# Patient Record
Sex: Female | Born: 1970 | Race: Black or African American | Hispanic: No | Marital: Single | Smoking: Never smoker
Health system: Southern US, Community
[De-identification: ages and names within clinical notes are randomized; demographics above are authoritative.]

## PROBLEM LIST (undated history)

## (undated) DIAGNOSIS — IMO0001 Reserved for inherently not codable concepts without codable children: Secondary | ICD-10-CM

## (undated) DIAGNOSIS — I10 Essential (primary) hypertension: Secondary | ICD-10-CM

## (undated) DIAGNOSIS — E039 Hypothyroidism, unspecified: Secondary | ICD-10-CM

## (undated) HISTORY — PX: BUNIONECTOMY: SHX129

## (undated) HISTORY — DX: Essential (primary) hypertension: I10

## (undated) HISTORY — PX: BREAST SURGERY: SHX581

## (undated) HISTORY — DX: Reserved for inherently not codable concepts without codable children: IMO0001

## (undated) HISTORY — PX: GASTRIC BYPASS: SHX52

## (undated) HISTORY — DX: Hypothyroidism, unspecified: E03.9

---

## 1998-04-30 ENCOUNTER — Emergency Department (HOSPITAL_COMMUNITY): Admission: EM | Admit: 1998-04-30 | Discharge: 1998-04-30 | Payer: Self-pay | Admitting: Emergency Medicine

## 1998-09-27 ENCOUNTER — Ambulatory Visit: Admission: RE | Admit: 1998-09-27 | Discharge: 1998-09-27 | Payer: Self-pay | Admitting: Otolaryngology

## 1999-02-07 ENCOUNTER — Other Ambulatory Visit: Admission: RE | Admit: 1999-02-07 | Discharge: 1999-02-07 | Payer: Self-pay | Admitting: Obstetrics and Gynecology

## 1999-10-21 ENCOUNTER — Emergency Department (HOSPITAL_COMMUNITY): Admission: EM | Admit: 1999-10-21 | Discharge: 1999-10-21 | Payer: Self-pay | Admitting: Emergency Medicine

## 1999-11-02 ENCOUNTER — Ambulatory Visit: Admission: RE | Admit: 1999-11-02 | Discharge: 1999-11-02 | Payer: Self-pay | Admitting: Otolaryngology

## 2001-02-25 ENCOUNTER — Emergency Department (HOSPITAL_COMMUNITY): Admission: EM | Admit: 2001-02-25 | Discharge: 2001-02-25 | Payer: Self-pay | Admitting: Emergency Medicine

## 2001-02-25 ENCOUNTER — Encounter: Payer: Self-pay | Admitting: Emergency Medicine

## 2001-11-17 ENCOUNTER — Encounter: Payer: Self-pay | Admitting: Emergency Medicine

## 2001-11-17 ENCOUNTER — Emergency Department (HOSPITAL_COMMUNITY): Admission: EM | Admit: 2001-11-17 | Discharge: 2001-11-17 | Payer: Self-pay | Admitting: Emergency Medicine

## 2007-06-29 ENCOUNTER — Other Ambulatory Visit: Admission: RE | Admit: 2007-06-29 | Discharge: 2007-06-29 | Payer: Self-pay | Admitting: Gynecology

## 2007-07-27 ENCOUNTER — Encounter: Admission: RE | Admit: 2007-07-27 | Discharge: 2007-07-27 | Payer: Self-pay | Admitting: Internal Medicine

## 2007-08-19 ENCOUNTER — Encounter (INDEPENDENT_AMBULATORY_CARE_PROVIDER_SITE_OTHER): Payer: Self-pay | Admitting: Interventional Radiology

## 2007-08-19 ENCOUNTER — Encounter: Admission: RE | Admit: 2007-08-19 | Discharge: 2007-08-19 | Payer: Self-pay | Admitting: Internal Medicine

## 2007-08-19 ENCOUNTER — Other Ambulatory Visit: Admission: RE | Admit: 2007-08-19 | Discharge: 2007-08-19 | Payer: Self-pay | Admitting: Interventional Radiology

## 2007-10-29 ENCOUNTER — Emergency Department (HOSPITAL_COMMUNITY): Admission: EM | Admit: 2007-10-29 | Discharge: 2007-10-29 | Payer: Self-pay | Admitting: Emergency Medicine

## 2008-03-09 ENCOUNTER — Encounter: Admission: RE | Admit: 2008-03-09 | Discharge: 2008-03-09 | Payer: Self-pay | Admitting: Internal Medicine

## 2008-06-09 ENCOUNTER — Encounter: Payer: Self-pay | Admitting: Endocrinology

## 2008-07-28 ENCOUNTER — Ambulatory Visit: Payer: Self-pay | Admitting: Gynecology

## 2008-07-28 ENCOUNTER — Encounter: Payer: Self-pay | Admitting: Gynecology

## 2008-07-28 ENCOUNTER — Other Ambulatory Visit: Admission: RE | Admit: 2008-07-28 | Discharge: 2008-07-28 | Payer: Self-pay | Admitting: Gynecology

## 2008-08-08 ENCOUNTER — Encounter: Admission: RE | Admit: 2008-08-08 | Discharge: 2008-08-08 | Payer: Self-pay | Admitting: Gynecology

## 2009-01-11 ENCOUNTER — Other Ambulatory Visit: Admission: RE | Admit: 2009-01-11 | Discharge: 2009-01-11 | Payer: Self-pay | Admitting: Internal Medicine

## 2009-08-16 ENCOUNTER — Emergency Department (HOSPITAL_COMMUNITY): Admission: EM | Admit: 2009-08-16 | Discharge: 2009-08-16 | Payer: Self-pay | Admitting: Emergency Medicine

## 2010-07-01 ENCOUNTER — Emergency Department (HOSPITAL_COMMUNITY)
Admission: EM | Admit: 2010-07-01 | Discharge: 2010-07-01 | Payer: Self-pay | Source: Home / Self Care | Admitting: Emergency Medicine

## 2010-08-12 ENCOUNTER — Encounter: Payer: Self-pay | Admitting: Internal Medicine

## 2010-10-18 ENCOUNTER — Other Ambulatory Visit: Payer: Self-pay | Admitting: Gynecology

## 2010-10-18 ENCOUNTER — Encounter (INDEPENDENT_AMBULATORY_CARE_PROVIDER_SITE_OTHER): Payer: Self-pay | Admitting: Gynecology

## 2010-10-18 ENCOUNTER — Other Ambulatory Visit (HOSPITAL_COMMUNITY)
Admission: RE | Admit: 2010-10-18 | Discharge: 2010-10-18 | Disposition: A | Discharge status: Home / Self Care | Payer: Self-pay | Source: Ambulatory Visit | Attending: Gynecology | Admitting: Gynecology

## 2010-10-18 DIAGNOSIS — Z833 Family history of diabetes mellitus: Secondary | ICD-10-CM

## 2010-10-18 DIAGNOSIS — Z01419 Encounter for gynecological examination (general) (routine) without abnormal findings: Secondary | ICD-10-CM

## 2010-10-18 DIAGNOSIS — R635 Abnormal weight gain: Secondary | ICD-10-CM

## 2010-10-18 DIAGNOSIS — N938 Other specified abnormal uterine and vaginal bleeding: Secondary | ICD-10-CM

## 2010-10-18 DIAGNOSIS — Z13220 Encounter for screening for lipoid disorders: Secondary | ICD-10-CM

## 2010-10-18 DIAGNOSIS — Z124 Encounter for screening for malignant neoplasm of cervix: Secondary | ICD-10-CM | POA: Insufficient documentation

## 2010-10-18 DIAGNOSIS — N949 Unspecified condition associated with female genital organs and menstrual cycle: Secondary | ICD-10-CM

## 2010-10-21 ENCOUNTER — Ambulatory Visit (INDEPENDENT_AMBULATORY_CARE_PROVIDER_SITE_OTHER): Payer: Self-pay | Admitting: Gynecology

## 2010-10-21 DIAGNOSIS — N920 Excessive and frequent menstruation with regular cycle: Secondary | ICD-10-CM

## 2010-10-21 DIAGNOSIS — D259 Leiomyoma of uterus, unspecified: Secondary | ICD-10-CM

## 2010-11-11 ENCOUNTER — Ambulatory Visit (INDEPENDENT_AMBULATORY_CARE_PROVIDER_SITE_OTHER): Payer: Self-pay | Admitting: Gynecology

## 2010-11-11 DIAGNOSIS — N949 Unspecified condition associated with female genital organs and menstrual cycle: Secondary | ICD-10-CM

## 2010-11-11 DIAGNOSIS — IMO0001 Reserved for inherently not codable concepts without codable children: Secondary | ICD-10-CM

## 2010-11-11 DIAGNOSIS — N938 Other specified abnormal uterine and vaginal bleeding: Secondary | ICD-10-CM

## 2010-11-11 HISTORY — DX: Reserved for inherently not codable concepts without codable children: IMO0001

## 2010-11-25 ENCOUNTER — Ambulatory Visit (INDEPENDENT_AMBULATORY_CARE_PROVIDER_SITE_OTHER): Payer: Self-pay | Admitting: Gynecology

## 2010-11-25 DIAGNOSIS — N898 Other specified noninflammatory disorders of vagina: Secondary | ICD-10-CM

## 2010-11-25 DIAGNOSIS — N760 Acute vaginitis: Secondary | ICD-10-CM

## 2010-11-25 DIAGNOSIS — N949 Unspecified condition associated with female genital organs and menstrual cycle: Secondary | ICD-10-CM

## 2010-11-25 DIAGNOSIS — N938 Other specified abnormal uterine and vaginal bleeding: Secondary | ICD-10-CM

## 2010-12-09 ENCOUNTER — Ambulatory Visit: Payer: Self-pay | Admitting: Gynecology

## 2011-02-18 ENCOUNTER — Emergency Department (HOSPITAL_COMMUNITY)
Admission: EM | Admit: 2011-02-18 | Discharge: 2011-02-18 | Disposition: A | Discharge status: Home / Self Care | Payer: Self-pay | Attending: Emergency Medicine | Admitting: Emergency Medicine

## 2011-02-18 DIAGNOSIS — M719 Bursopathy, unspecified: Secondary | ICD-10-CM | POA: Insufficient documentation

## 2011-02-18 DIAGNOSIS — M67919 Unspecified disorder of synovium and tendon, unspecified shoulder: Secondary | ICD-10-CM | POA: Insufficient documentation

## 2011-02-18 DIAGNOSIS — I10 Essential (primary) hypertension: Secondary | ICD-10-CM | POA: Insufficient documentation

## 2011-04-23 DIAGNOSIS — E039 Hypothyroidism, unspecified: Secondary | ICD-10-CM | POA: Insufficient documentation

## 2011-04-23 DIAGNOSIS — I10 Essential (primary) hypertension: Secondary | ICD-10-CM | POA: Insufficient documentation

## 2011-04-23 DIAGNOSIS — IMO0001 Reserved for inherently not codable concepts without codable children: Secondary | ICD-10-CM | POA: Insufficient documentation

## 2011-04-24 ENCOUNTER — Other Ambulatory Visit: Payer: Self-pay | Admitting: Gynecology

## 2011-04-24 DIAGNOSIS — N920 Excessive and frequent menstruation with regular cycle: Secondary | ICD-10-CM

## 2011-04-28 ENCOUNTER — Inpatient Hospital Stay: Admission: RE | Admit: 2011-04-28 | Payer: Self-pay | Source: Ambulatory Visit

## 2011-04-28 ENCOUNTER — Ambulatory Visit: Payer: Self-pay | Admitting: Gynecology

## 2011-04-29 ENCOUNTER — Ambulatory Visit (INDEPENDENT_AMBULATORY_CARE_PROVIDER_SITE_OTHER): Payer: Self-pay

## 2011-04-29 ENCOUNTER — Ambulatory Visit (INDEPENDENT_AMBULATORY_CARE_PROVIDER_SITE_OTHER): Payer: Self-pay | Admitting: Gynecology

## 2011-04-29 ENCOUNTER — Ambulatory Visit: Payer: Self-pay

## 2011-04-29 ENCOUNTER — Ambulatory Visit: Payer: Self-pay | Admitting: Gynecology

## 2011-04-29 ENCOUNTER — Encounter: Payer: Self-pay | Admitting: Gynecology

## 2011-04-29 VITALS — BP 134/88

## 2011-04-29 DIAGNOSIS — N8300 Follicular cyst of ovary, unspecified side: Secondary | ICD-10-CM

## 2011-04-29 DIAGNOSIS — I10 Essential (primary) hypertension: Secondary | ICD-10-CM

## 2011-04-29 DIAGNOSIS — D259 Leiomyoma of uterus, unspecified: Secondary | ICD-10-CM

## 2011-04-29 DIAGNOSIS — E039 Hypothyroidism, unspecified: Secondary | ICD-10-CM

## 2011-04-29 DIAGNOSIS — N938 Other specified abnormal uterine and vaginal bleeding: Secondary | ICD-10-CM

## 2011-04-29 DIAGNOSIS — D251 Intramural leiomyoma of uterus: Secondary | ICD-10-CM

## 2011-04-29 DIAGNOSIS — N949 Unspecified condition associated with female genital organs and menstrual cycle: Secondary | ICD-10-CM

## 2011-04-29 NOTE — Progress Notes (Signed)
The patient is a 40 year old gravida 0 with a same-sex relationship who presented to the office for an ultrasound and further discussion of her spotting she's had ever since she had a Mirena IUD placed back in April of this year. The Mirena IUD had been placed because of her history of menorrhagia and dysmenorrhea which has improved tremendously and she's happy with the outcome. She has a history of hypothyroidism and hypertension she has not seen her primary physician and close to a year. She is on Norvasc 10 mg daily and also on Synthroid 175 mcg daily. The patient is overweight.  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 Both ovaries appeared to be normal. The IUD was seen in normal position and the intrauterine cavity. Endometrial stripe was 4.1 mm.  Because of her history of hypothyroidism but has not had her thyroid checked and quite some time we'll go and check her TSH today. We'll check her prolactin as part of her evaluation for dysfunctional uterine bleeding. She'll be placed on Megace 40 mg twice a day for 15 days. We'll monitor her symptoms afterwards. We'll also check her CBC to look for platelet count as well. Her Pap smear in April of this year was normal. She was encouraged to continue monthly self breast examination and will follow with the above symptoms over the course of the next 3-6 months. She was instructed to engage in some form of exercise 45 minutes 34 times a week.

## 2011-04-29 NOTE — Patient Instructions (Signed)
Take the Megace one tablet two times a day for 15 days. Will call you if lab results are abnormal.

## 2011-05-19 ENCOUNTER — Telehealth: Payer: Self-pay | Admitting: *Deleted

## 2011-05-19 NOTE — Telephone Encounter (Signed)
Patient was seen on 04/29/11 with Dr. Glenetta Hew.  Has been on Megace 40mg  BID for over two weeks now, but does continue to bleed some.  Not really a flow, but it never went away.  Should we increase per day or come up with another plan?

## 2011-05-19 NOTE — Telephone Encounter (Signed)
Patient has a Mirena IUD. She's had a normal TSH and prolactin,  Amy please call patient and instruct to monitor her bleeding, how many pads per day, if greater than one pad an hour instructed to call immediately. Review she may have cycles monthly with the IUD. If continues to bleed daily to schedule an appointment with Dr. Lily Peer.

## 2011-05-20 NOTE — Telephone Encounter (Signed)
Patient informed.  Only changing pad once daily.  Will monitor.

## 2011-05-22 ENCOUNTER — Telehealth: Payer: Self-pay | Admitting: *Deleted

## 2011-05-22 NOTE — Telephone Encounter (Signed)
Pt called complaining of still spotting while on the megace per J[facility] office note pt is take megace twice a day for 15 days then watch for bleeding after taking medication. Pt informed of this and will follow up after taking all of the megace.

## 2011-06-04 ENCOUNTER — Other Ambulatory Visit: Payer: Self-pay | Admitting: Women's Health

## 2011-06-04 NOTE — Telephone Encounter (Signed)
Please call patient, instructed to take a woman's One-A-Day that has iron in it. Will check a CBC next time she is in the office  .(had a normal cbc 10/12)

## 2011-06-06 NOTE — Telephone Encounter (Signed)
PT. NOTIFIED OF NANCY'S NOTE BELOW. RX WAS SENT TO HER IN ERROR BY THE PHARMACY. SHE HAS ONLY SEEN DR. Glenetta Hew.

## 2011-07-23 ENCOUNTER — Telehealth: Payer: Self-pay | Admitting: *Deleted

## 2011-07-23 NOTE — Telephone Encounter (Signed)
Pt called Jocelyn Carpenter c/o bleeding still after IUD. Jocelyn Carpenter for pt to call.

## 2011-07-24 NOTE — Telephone Encounter (Signed)
PT CALLED STATING SHE IS HAVING TO MANY ISSUES REGARDING BLEEDING WITH MIRENA IUD, PT IS WANTING IUD REMOVED. PT TRANSFERRED TO APPOINTMENT DESK TO HAVE THIS DONE.

## 2011-07-25 ENCOUNTER — Ambulatory Visit: Payer: Self-pay | Admitting: Gynecology

## 2011-07-28 ENCOUNTER — Ambulatory Visit (INDEPENDENT_AMBULATORY_CARE_PROVIDER_SITE_OTHER): Payer: Self-pay | Admitting: Gynecology

## 2011-07-28 ENCOUNTER — Encounter: Payer: Self-pay | Admitting: Gynecology

## 2011-07-28 VITALS — BP 128/76

## 2011-07-28 DIAGNOSIS — D219 Benign neoplasm of connective and other soft tissue, unspecified: Secondary | ICD-10-CM | POA: Insufficient documentation

## 2011-07-28 DIAGNOSIS — D259 Leiomyoma of uterus, unspecified: Secondary | ICD-10-CM

## 2011-07-28 DIAGNOSIS — Z975 Presence of (intrauterine) contraceptive device: Secondary | ICD-10-CM

## 2011-07-28 DIAGNOSIS — Z30432 Encounter for removal of intrauterine contraceptive device: Secondary | ICD-10-CM

## 2011-07-28 DIAGNOSIS — N938 Other specified abnormal uterine and vaginal bleeding: Secondary | ICD-10-CM

## 2011-07-28 DIAGNOSIS — N921 Excessive and frequent menstruation with irregular cycle: Secondary | ICD-10-CM

## 2011-07-28 DIAGNOSIS — N949 Unspecified condition associated with female genital organs and menstrual cycle: Secondary | ICD-10-CM

## 2011-07-28 MED ORDER — MEGESTROL ACETATE 40 MG PO TABS: 40.0000 mg | ORAL_TABLET | Freq: Two times a day (BID) | ORAL | Status: DC

## 2011-07-28 NOTE — Progress Notes (Signed)
patient is a 41 year old gravida 1 para 0 AB 1 and a same-sex relationship who has had history of menorrhagia and fibroid uterus. She had a Mirena IUD placed in the office in 11/11/2010 and she reports continuing to bleed bright red or dark red at times and wanted to have the Mirena IUD removed today. Patient had an ultrasound last year which demonstrated she had a uterus that measured 10 x 6 x 6 cm with several myomas intramural the largest one measuring 36 x 47 mm. Ovaries otherwise normal.  Exam: Abdomen: Soft nontender no rebound or guarding Bartholin urethra Skene glands: Within normal limits Vagina: Dark brown blood present in the vaginal vault Cervix: IUD string seen after the cervix was cleansed with Betadine solution a [place] clamp was utilized to grasp the string and the IUD was removed shown to the patient and discarded. Uterus: Irregular shaped 10-12 weeks size. Adnexa: Difficult to assess due to patient's abdominal girth/overweight.  We had a discussion of alternatives offer to her one would be an endometrial ablation here in the office such as with her option technique. We'll place her on Megace 40 mg twice a day for 10 days to help with her bleeding. She will return back to the office within a week so that we can proceed with an endometrial biopsy and a sonohysterogram. If her insurance approval for her to have an endometrial ablation will do it this month if not we did discuss and literature formation was provided on laparoscopic hysterectomy with ovarian conservation. She had iron deficiency anemia but is been back on iron supplementation and her last hemoglobin was October 2012 with a value of 13.7 hematocrit 41.3 and normal platelet count.

## 2011-07-28 NOTE — Patient Instructions (Signed)
Laparoscopic Hysterectomy Laparoscopic surgery is an alternative to open surgery. A laparoscopic hysterectomy is a procedure to remove your womb (uterus). This procedure has a shorter recovery time, less discomfort, and is less expensive than an open operation. Laparoscopic surgery allows you to return to normal activities and recover faster. If laparoscopy is started and your surgeon feels it is not safe to continue on because of various reasons, it will be converted to an open abdominal procedure. A laparoscopic assisted vaginal hysterectomy (LAVH) is done when a vaginal hysterectomy (removing the uterus through the vagina) should not be done because of an increase risk of complications. Laparoscopic procedures may not be good to do when:   There is major scarring from:   Previous surgery.   Infection.   Endometriosis.   There are bleeding disorders.   There are other conditions which make the laparoscopic procedure impossible   The uterus is too large.   There are too many large fibroids.   There may be cancer of one of the female organs.  It will no longer be possible to have menstrual periods or become pregnant. Removal of the tubes and ovaries, is sometimes also done with this. If the tubes and ovaries are removed before menopause, you will go through a sudden (abrupt) menopause. This can be helped with hormone medications.  The birth canal (vagina) is left intact and this procedure will not affect your sex drive or sexual relationship.  LET YOUR CAREGIVERS KNOW ABOUT:  Allergies.   Medications taken including herbs, eye drops, over the counter medications, and creams.   Use of steroids (by mouth or creams).   Previous problems with anesthetics or Novocaine.   Possibility of pregnancy, if this applies.   History of blood clots (thrombophlebitis).   History of bleeding or blood problems.   Previous surgery.   Other health problems.  RISKS AND COMPLICATIONS  Some  problems that can occur following this procedure include:  A germ starts growing in the wound (infection). This can usually be treated with medications which kill germs (antibiotics).   Bleeding following surgery. Your surgeon takes every precaution to keep this from happening.   Blood clots to your leg or lung.   Damage to other organs.   Anesthetic side effects (allergic to the anesthetic, too little or too much anesthesia).   Rarely can death occur with this or any operation.  PROCEDURE  You will be given an anesthetic. This will keep you pain free during surgery. When you are asleep, a harmless gas (carbon dioxide) will be used to inflate your abdomen. This will allow your surgeon to look around inside your abdomen and perform your surgery and treat any other problems found if necessary. Several small incisions will be made in your abdomen. One of these incisions will be made in the area of your belly button Equities trader) to insert a laparoscope a small telescope with a light on the end of it). Your surgeon will look through the laparoscope while doing your procedure. Many surgeons attach a video camera to the laparoscope to enlarge the view of the pelvic organs and so they can be seen on a monitor (TV screen). Operating instruments used to perform the surgery will be inserted through the other incisions. AFTER THE PROCEDURE  After the procedure, the gas is released from inside your abdomen. Your incisions are closed. Because these incisions are small (usually less than one-half inch), there is usually minimal discomfort following the procedure.   You will be  taken to the recovery area where a nurse will watch and check your progress. Once you are awake, stable, and taking fluids well, barring other problems, you will be returned to your room or allowed to go home.   You will have some mild discomfort in the throat. This is from the tube placed in your throat while you were sleeping.   Do not  drink alcohol, drive a car, use public transportation or sign important papers for at least 1 to 2 days following surgery.   Try to have someone with you the first 3 to 5 days after you go home.  HOME CARE INSTRUCTIONS  Healing will take time. You will have discomfort, tenderness, swelling and bruising at the operative site for a couple of weeks. This is normal and will get better as time goes on.   Only take over-the-counter or prescription medicines for pain, discomfort or fever as directed by your caregiver.   Do not take aspirin. It can cause bleeding.   Do not drive when taking pain medication.   Follow your caregiver's advice regarding diet, exercise, lifting, driving and general activities.   Resume your usual diet as directed and allowed.   Get plenty of rest and sleep.   Do not douche, use tampons, or have sexual intercourse until your caregiver gives you permission.   Change your bandages (dressings) as directed.   Take your temperature twice a day. Write it down.   Your caregiver may recommend showers instead of baths for a few weeks.   Do not drink alcohol until your caregiver gives you permission.   If you develop constipation, you may take a mild laxative with your caregiver's permission. Bran foods and drinking fluids helps with constipation problems.   Try to have someone home with you for a week or two to help with the household activities.   Make sure you and your family understands everything about your operation and recovery.   Do not sign any legal documents until you feel normal again.   Keep all your follow-up appointments as recommended by your caregiver.  SEEK MEDICAL CARE IF:   There is swelling, redness or increasing pain in the wound area.   Pus is coming from the wound.   You notice a bad smell from the wound or surgical dressing.   You have pain, redness and swelling from the intravenous site.   The wound is breaking open (the edges are not  staying together).   You feel dizzy or feel like fainting.   You develop pain or bleeding when you urinate.   You develop diarrhea.   You develop nausea and vomiting.   You develop abnormal vaginal discharge.   You develop a rash.   You have any type of abnormal reaction or develop an allergy to your medication.   You need stronger pain medication for your pain.  SEEK IMMEDIATE MEDICAL CARE:  You develop a temperature of 102 F (38.9 C) or higher.   You develop abdominal pain.   You develop chest pain.   You develop shortness of breath.   You pass out.   You develop pain, swelling or redness of your leg.   You develop heavy vaginal bleeding with or without blood clots.  Document Released: 05/04/2007 Document Revised: 08/09/2010 Document Reviewed: 03/08/2009 [facility] Patient Information 2012 [place], [place].

## 2011-07-30 ENCOUNTER — Telehealth: Payer: Self-pay

## 2011-07-30 NOTE — Telephone Encounter (Signed)
(  Dr. Glenetta Hew had sent me a request to check insurance benefits for the patient for Her Option Ablation and TLH so patient could decide financially which one she wanted to do.  Turns out the patient has no insurance.  She told me she did and there was a letter in her chart. The letter was about an 80% discount of services that [facility] had offered her.  PH spoke with the financial assistance coordinator at [facility] and she said that the discount expired in July and they sent the patient another financial application but never heard from her.)  I told patient the above information and that at this moment she is considered to be a private pay patient and would be expected to pay for her surgery in advance.  I also reminded her she has a $1300.00 balance that she owes Dr. Lily Peer currently.  She said she would be going to Financial Assistance today to do an application and get a copy of bill for [facility] balance. I told her once the gets it worked out to give me a call and we can see how she wants to proceed.

## 2011-08-01 ENCOUNTER — Other Ambulatory Visit: Payer: Self-pay

## 2011-08-01 ENCOUNTER — Ambulatory Visit: Payer: Self-pay | Admitting: Gynecology

## 2011-08-01 ENCOUNTER — Telehealth: Payer: Self-pay | Admitting: *Deleted

## 2011-08-01 MED ORDER — MEDROXYPROGESTERONE ACETATE 10 MG PO TABS: ORAL_TABLET | ORAL | Status: DC

## 2011-08-01 NOTE — Telephone Encounter (Signed)
Pt was seen on 07/28/11 for IUD removal due to irregular bleeding, pt benefits were checked for Her Option and TLH pt has no insurance so procedure was canceled. She was given megace 40 mg 1 by mouth twice daily. Pt wants to know if she should continue medication since procedure was canceled? Pt noticed since taking megace bleeding is much heavier. Please advise  Patient with history of fibroid uterus options left are minimal. She can stop her Megace altogether she can be put on a low dose oral contraceptive pill to take continuously such as Junel 1/20 and withdrawal every 3 months. This is a generic form of oral contraceptive pill. Are we can place her on Provera 10 mg to take for 10 days of each month and monitor her cycles.

## 2011-08-01 NOTE — Telephone Encounter (Signed)
Pt was seen on 07/28/11 for IUD removal due to irregular bleeding, pt benefits were checked for Her Option and TLH pt has no insurance so procedure was canceled. She was given megace 40 mg 1 by mouth twice daily. Pt wants to know if she should continue medication since procedure was canceled? Pt noticed since taking megace bleeding is much heavier. Please advise

## 2011-08-01 NOTE — Telephone Encounter (Signed)
Pt will stop taking megace and wants to start on provera 10 mg of each month and monitor her cycles.

## 2011-08-25 ENCOUNTER — Telehealth: Payer: Self-pay | Admitting: *Deleted

## 2011-08-25 NOTE — Telephone Encounter (Signed)
Please call Amy Norvasc 5 mg to take 1 by mouth daily #30 with 2 refills.

## 2011-08-25 NOTE — Telephone Encounter (Signed)
Pt informed with the below note, rx called in to [facility] in [place] [phone]

## 2011-08-25 NOTE — Telephone Encounter (Signed)
Pt is currently in [place] because her sister had surgery this am. Pt is calling wanting to know if you could refill her blood pressure medication Norvasc 5 mg 1 by mouth daily? Pt said that you have done this in past for her. Please advise

## 2011-09-19 ENCOUNTER — Telehealth: Payer: Self-pay | Admitting: *Deleted

## 2011-09-19 MED ORDER — NORETHINDRONE ACET-ETHINYL EST 1-20 MG-MCG PO TABS: 1.0000 | ORAL_TABLET | Freq: Every day | ORAL | Status: DC

## 2011-09-19 NOTE — Telephone Encounter (Signed)
Pt called requesting refill on birth control pills, rx sent to pharmacy.

## 2011-10-07 ENCOUNTER — Telehealth: Payer: Self-pay

## 2011-10-07 NOTE — Telephone Encounter (Signed)
Patient called to inquire how much office endometrial ablation would cost. We had discussed this previously as she is private pay but she did not recall.  I explained the charge for Her Option Ablation in the office is $5454 but the allowable ins cost is $4590 so that is the private pay charge amount.

## 2012-01-20 ENCOUNTER — Telehealth: Payer: Self-pay | Admitting: Gynecology

## 2012-01-20 NOTE — Telephone Encounter (Signed)
Patient dropped by a copy of letter from [facility] stating she qualified for 100% discount on her hospital account. I explained to her that [facility] does not participate with this discount program.  Patient said she just wanted to check and see.

## 2012-10-06 ENCOUNTER — Other Ambulatory Visit: Payer: Self-pay | Admitting: Family Medicine

## 2012-10-06 DIAGNOSIS — Z1231 Encounter for screening mammogram for malignant neoplasm of breast: Secondary | ICD-10-CM

## 2012-10-25 ENCOUNTER — Ambulatory Visit: Payer: Self-pay

## 2012-12-10 ENCOUNTER — Encounter: Payer: Self-pay | Admitting: Gynecology

## 2012-12-14 ENCOUNTER — Ambulatory Visit
Admission: RE | Admit: 2012-12-14 | Discharge: 2012-12-14 | Disposition: A | Discharge status: Home / Self Care | Payer: BC Managed Care – PPO | Source: Ambulatory Visit | Attending: Family Medicine | Admitting: Family Medicine

## 2012-12-14 ENCOUNTER — Other Ambulatory Visit: Payer: Self-pay | Admitting: Family Medicine

## 2012-12-14 DIAGNOSIS — S161XXA Strain of muscle, fascia and tendon at neck level, initial encounter: Secondary | ICD-10-CM

## 2012-12-14 DIAGNOSIS — Z1231 Encounter for screening mammogram for malignant neoplasm of breast: Secondary | ICD-10-CM

## 2013-05-18 ENCOUNTER — Institutional Professional Consult (permissible substitution): Payer: Self-pay | Admitting: Neurology

## 2013-05-19 ENCOUNTER — Telehealth: Payer: Self-pay | Admitting: Neurology

## 2013-05-30 ENCOUNTER — Institutional Professional Consult (permissible substitution): Payer: Self-pay | Admitting: Neurology

## 2013-06-10 ENCOUNTER — Institutional Professional Consult (permissible substitution): Payer: Self-pay | Admitting: Neurology

## 2013-06-10 ENCOUNTER — Telehealth: Payer: Self-pay | Admitting: Neurology

## 2013-06-10 NOTE — Telephone Encounter (Signed)
I called and left a message for the patient to bring her CPAP machine if she still has it.

## 2013-06-10 NOTE — Telephone Encounter (Signed)
I called and left a message for the patient to callback to verify her insurance because its saying her card on file is elasped and maybe she has new card. I informed the patient that she would need to bring $200.00 to the visit if she has no insurance.

## 2014-05-22 ENCOUNTER — Encounter: Payer: Self-pay | Admitting: Gynecology

## 2014-07-19 NOTE — Telephone Encounter (Signed)
error 

## 2016-09-14 ENCOUNTER — Emergency Department (HOSPITAL_COMMUNITY)
Admission: EM | Admit: 2016-09-14 | Discharge: 2016-09-14 | Disposition: A | Discharge status: Home / Self Care | Payer: Managed Care, Other (non HMO) | Attending: Emergency Medicine | Admitting: Emergency Medicine

## 2016-09-14 ENCOUNTER — Emergency Department (HOSPITAL_COMMUNITY): Payer: Managed Care, Other (non HMO)

## 2016-09-14 ENCOUNTER — Encounter (HOSPITAL_COMMUNITY): Payer: Self-pay | Admitting: Emergency Medicine

## 2016-09-14 DIAGNOSIS — Y9301 Activity, walking, marching and hiking: Secondary | ICD-10-CM | POA: Diagnosis not present

## 2016-09-14 DIAGNOSIS — S82142A Displaced bicondylar fracture of left tibia, initial encounter for closed fracture: Secondary | ICD-10-CM

## 2016-09-14 DIAGNOSIS — Y929 Unspecified place or not applicable: Secondary | ICD-10-CM | POA: Insufficient documentation

## 2016-09-14 DIAGNOSIS — S82122A Displaced fracture of lateral condyle of left tibia, initial encounter for closed fracture: Secondary | ICD-10-CM | POA: Diagnosis not present

## 2016-09-14 DIAGNOSIS — S8992XA Unspecified injury of left lower leg, initial encounter: Secondary | ICD-10-CM | POA: Diagnosis present

## 2016-09-14 DIAGNOSIS — W109XXA Fall (on) (from) unspecified stairs and steps, initial encounter: Secondary | ICD-10-CM | POA: Diagnosis not present

## 2016-09-14 DIAGNOSIS — Z79899 Other long term (current) drug therapy: Secondary | ICD-10-CM | POA: Insufficient documentation

## 2016-09-14 DIAGNOSIS — I10 Essential (primary) hypertension: Secondary | ICD-10-CM | POA: Insufficient documentation

## 2016-09-14 DIAGNOSIS — Y999 Unspecified external cause status: Secondary | ICD-10-CM | POA: Insufficient documentation

## 2016-09-14 DIAGNOSIS — E039 Hypothyroidism, unspecified: Secondary | ICD-10-CM | POA: Insufficient documentation

## 2016-09-14 DIAGNOSIS — S82832A Other fracture of upper and lower end of left fibula, initial encounter for closed fracture: Secondary | ICD-10-CM

## 2016-09-14 MED ORDER — HYDROCODONE-ACETAMINOPHEN 5-325 MG PO TABS: 1.0000 | ORAL_TABLET | ORAL | 0 refills | Status: DC | PRN

## 2016-09-14 MED ORDER — OXYCODONE-ACETAMINOPHEN 5-325 MG PO TABS
1.0000 | ORAL_TABLET | Freq: Once | ORAL | Status: AC
  Administered 2016-09-14: 1 via ORAL
  Filled 2016-09-14: qty 1

## 2016-09-14 MED ORDER — MORPHINE SULFATE (PF) 4 MG/ML IV SOLN
4.0000 mg | Freq: Once | INTRAVENOUS | Status: AC
  Administered 2016-09-14: 4 mg via INTRAMUSCULAR
  Filled 2016-09-14: qty 1

## 2016-09-14 MED ORDER — IBUPROFEN 800 MG PO TABS
800.0000 mg | ORAL_TABLET | Freq: Once | ORAL | Status: AC
  Administered 2016-09-14: 800 mg via ORAL
  Filled 2016-09-14: qty 1

## 2016-09-14 NOTE — ED Provider Notes (Signed)
[facility] DEPT Provider Note   CSN: [id number] Arrival date & time: 09/14/16  [id number]     History   Chief Complaint Chief Complaint  Patient presents with  . Ankle Injury    HPI Jocelyn Carpenter is a 46 y.o. female.  HPI   Pt p/w left knee and ankle pain that began at 3:30am when she was walking up the stairs and rolled her ankle, causing her to fall to the ground.  She was walking up the stairs and fell directly on her knee.  Has pain in her lateral ankle and over her lateral knee.  Pain is constant, worse with palpation.  Denies any other injury.  Denies weakness or numbness.   Orthopedist is Dr Rip Harbour, [facility].    Past Medical History:  Diagnosis Date  . Hypertension   . Hypothyroidism   . IUD 11/11/10   MIRENA IUD INSERTED    Patient Active Problem List   Diagnosis Date Noted  . Fibroids 07/28/2011  . IUD   . Hypertension   . Hypothyroidism     Past Surgical History:  Procedure Laterality Date  . BREAST SURGERY     REDUCTION MAMMAPLASTY  . BUNIONECTOMY     RIGHT AND LEFT  . GASTRIC BYPASS      OB History    Gravida Para Term Preterm AB Living   1 0     1 0   SAB TAB Ectopic Multiple Live Births                   Home Medications    Prior to Admission medications   Medication Sig Start Date End Date Taking? Authorizing Provider  amLODipine (NORVASC) 10 MG tablet Take 10 mg by mouth daily.    Yes Historical Provider, MD  clonazePAM (KLONOPIN) 0.5 MG tablet Take 0.5 tablets by mouth daily. 08/25/16  Yes Historical Provider, MD  FeFum-FePoly-FA-B Cmp-C-Biot (INTEGRA PLUS PO) Take 1 tablet by mouth daily.   Yes Historical Provider, MD  hydrochlorothiazide (HYDRODIURIL) 25 MG tablet Take 25 mg by mouth daily.   Yes Historical Provider, MD  levothyroxine (SYNTHROID, LEVOTHROID) 150 MCG tablet Take 175 mcg by mouth daily.     Yes Historical Provider, MD  phentermine (ADIPEX-P) 37.5 MG tablet Take 37.5 mg by mouth daily. 08/14/16  Yes  Historical Provider, MD  HYDROcodone-acetaminophen (NORCO/VICODIN) 5-325 MG tablet Take 1-2 tablets by mouth every 4 (four) hours as needed for moderate pain or severe pain. 09/14/16   Clayton Bibles, PA-C  norethindrone-ethinyl estradiol (MICROGESTIN,JUNEL,LOESTRIN) 1-20 MG-MCG tablet Take 1 tablet by mouth daily. 09/19/11 09/18/12  Terrance Mass, MD    Family History Family History  Problem Relation Age of Onset  . Diabetes Father   . Hypertension Father     Social History Social History  Substance Use Topics  . Smoking status: Never Smoker  . Smokeless tobacco: Never Used  . Alcohol use Yes     Comment: glass a wine at night     Allergies   Patient has no known allergies.   Review of Systems Review of Systems  Constitutional: Negative for diaphoresis and fatigue.  Musculoskeletal: Positive for arthralgias, gait problem and joint swelling. Negative for back pain.  Skin: Negative for color change.  Neurological: Negative for weakness and numbness.  Hematological: Does not bruise/bleed easily.  Psychiatric/Behavioral: Negative for self-injury.     Physical Exam Updated Vital Signs BP 129/88 (BP Location: Right Arm)   Pulse 89   Temp 98.9  F (37.2 C) (Oral)   Resp 14   Ht 5\' 8"  (1.727 m)   Wt 105.2 kg   LMP  (Exact Date)   SpO2 98%   BMI 35.28 kg/m   Physical Exam  Constitutional: She appears well-developed and well-nourished. No distress.  HENT:  Head: Normocephalic and atraumatic.  Neck: Neck supple.  Pulmonary/Chest: Effort normal.  Musculoskeletal:       Left knee: She exhibits decreased range of motion. She exhibits no deformity, no laceration and no erythema. Tenderness found. Lateral joint line tenderness noted.       Left ankle: She exhibits decreased range of motion and swelling. She exhibits no ecchymosis, no laceration and normal pulse. Tenderness. Lateral malleolus tenderness found.       Legs:      Left foot: Normal.  Neurological: She is alert.    Skin: She is not diaphoretic.  Nursing note and vitals reviewed.    ED Treatments / Results  Labs (all labs ordered are listed, but only abnormal results are displayed) Labs Reviewed - No data to display  EKG  EKG Interpretation None       Radiology Dg Tibia/fibula Left  Result Date: 09/14/2016 CLINICAL DATA:  46 year old female with left ankle twisting and pain. EXAM: LEFT FOOT - COMPLETE 3+ VIEW; LEFT TIBIA AND FIBULA - 2 VIEW COMPARISON:  None. FINDINGS: Foot: There is no acute fracture or dislocation. A fixation pain noted in the first metatarsal head. No significant arthritic changes. The soft tissues appear unremarkable. Tibia fibula and ankle: There is a faint oblique lucency in the distal fibula may represent a nondisplaced cortical fracture. There is no dislocation. The ankle mortise is intact. There is mild soft tissue swelling over the ankle and lateral malleolus. There is mild osteoarthritic changes of the knee. A triangular bone irregularity in the lateral aspect of the lateral tibial plateau noted which may be chronic and related to prior fracture. IMPRESSION: 1. No acute fracture or dislocation of the foot. 2. Faint oblique linear lucency in the distal fibula may represent a nondisplaced cortical fracture. Clinical correlation is recommended. There is soft tissue swelling over the lateral malleolus. 3. Mild osteoarthritic changes of the knee. Irregularity of the lateral aspect of the lateral tibial plateau likely chronic. Clinical correlation is recommended. Electronically Signed   By: Anner Crete M.D.   On: 09/14/2016 06:30   Ct Knee Left Wo Contrast  Result Date: 09/14/2016 CLINICAL DATA:  46 year old female with acute left knee pain following twisting injury yesterday. Initial encounter. EXAM: CT OF THE LEFT KNEE WITHOUT CONTRAST TECHNIQUE: Multidetector CT imaging of the LEFT knee was performed according to the standard protocol. Multiplanar CT image reconstructions  were also generated. COMPARISON:  09/14/2016 radiographs FINDINGS: A lateral tibial plateau fracture is identified with 2.5 mm depression. No split identified. No other fracture, subluxation or dislocation identified. A small moderate knee effusion is present with several calcified bodies in the suprapatellar bursa. Moderate tricompartmental degenerative changes are noted manifested by joint space loss and osteophytosis. No other significant abnormalities identified. IMPRESSION: Lateral tibial plateau fracture with 2.5 mm depression. Small to moderate knee effusion with calcified loose bodies in the suprapatellar bursa. Moderate tricompartmental degenerative changes. Electronically Signed   By: Margarette Canada M.D.   On: 09/14/2016 07:56   Dg Foot Complete Left  Result Date: 09/14/2016 CLINICAL DATA:  46 year old female with left ankle twisting and pain. EXAM: LEFT FOOT - COMPLETE 3+ VIEW; LEFT TIBIA AND FIBULA - 2 VIEW  COMPARISON:  None. FINDINGS: Foot: There is no acute fracture or dislocation. A fixation pain noted in the first metatarsal head. No significant arthritic changes. The soft tissues appear unremarkable. Tibia fibula and ankle: There is a faint oblique lucency in the distal fibula may represent a nondisplaced cortical fracture. There is no dislocation. The ankle mortise is intact. There is mild soft tissue swelling over the ankle and lateral malleolus. There is mild osteoarthritic changes of the knee. A triangular bone irregularity in the lateral aspect of the lateral tibial plateau noted which may be chronic and related to prior fracture. IMPRESSION: 1. No acute fracture or dislocation of the foot. 2. Faint oblique linear lucency in the distal fibula may represent a nondisplaced cortical fracture. Clinical correlation is recommended. There is soft tissue swelling over the lateral malleolus. 3. Mild osteoarthritic changes of the knee. Irregularity of the lateral aspect of the lateral tibial plateau  likely chronic. Clinical correlation is recommended. Electronically Signed   By: Anner Crete M.D.   On: 09/14/2016 06:30    Procedures Procedures (including critical care time)  Medications Ordered in ED Medications  oxyCODONE-acetaminophen (PERCOCET/ROXICET) 5-325 MG per tablet 1 tablet (1 tablet Oral Given 09/14/16 0655)  oxyCODONE-acetaminophen (PERCOCET/ROXICET) 5-325 MG per tablet 1 tablet (1 tablet Oral Given 09/14/16 0823)  ibuprofen (ADVIL,MOTRIN) tablet 800 mg (800 mg Oral Given 09/14/16 0912)  morphine 4 MG/ML injection 4 mg (4 mg Intramuscular Given 09/14/16 0912)     Initial Impression / Assessment and Plan / ED Course  I have reviewed the triage vital signs and the nursing notes.  Pertinent labs & imaging results that were available during my care of the patient were reviewed by me and considered in my medical decision making (see chart for details).    Afebrile, nontoxic patient with injury to her left knee and ankle while tripping and falling going up stairs.   Xray demonstrates possible distal fibular fracture and chronic-appearing lateral tibial plateau.  Pt tender over both of these places.  CT knee demonstrates acute fracture.  Discussed pt with Dr Mayer Camel who also reviewed the images. He advised long posterior splint and walker, follow up with Dr Jacquiline Doe in the office early this week.   D/C home with norco, motrin, splinting and walker as above.  Discussed result, findings, treatment, and follow up  with patient.  Pt given return precautions.  Pt verbalizes understanding and agrees with plan.      Final Clinical Impressions(s) / ED Diagnoses   Final diagnoses:  Tibial plateau fracture, left, closed, initial encounter  Closed fracture of distal end of left fibula, unspecified fracture morphology, initial encounter    New Prescriptions Discharge Medication List as of 09/14/2016 11:54 AM    START taking these medications   Details  HYDROcodone-acetaminophen  (NORCO/VICODIN) 5-325 MG tablet Take 1-2 tablets by mouth every 4 (four) hours as needed for moderate pain or severe pain., Starting Sun 09/14/2016, Print         [place], PA-C 09/14/16 [place], MD 09/15/16 [phone]

## 2016-09-14 NOTE — ED Triage Notes (Addendum)
Patient was going up some steps and twisted. Patient left ankle is swollen. The pain is in the left ankle radiating up the leg to the bottom of the knee. Patient hurt self 3:30 pm.

## 2016-09-14 NOTE — ED Notes (Signed)
No respiratory or acute distress noted alert and oriented x 3 call light in reach visitor at bedside. 

## 2016-09-14 NOTE — Discharge Instructions (Signed)
Read the information below.  Use the prescribed medication as directed.  Please discuss all new medications with your pharmacist.  Do not take additional tylenol while taking the prescribed pain medication to avoid overdose.  You may return to the Emergency Department at any time for worsening condition or any new symptoms that concern you.    If you develop uncontrolled pain, weakness or numbness of the extremity, severe discoloration of the skin, or you are unable to move your toes, return to the ER for a recheck.

## 2016-09-14 NOTE — Care Management Note (Signed)
Case Management Note  Patient Details  Name: Jocelyn Carpenter MRN: [id number] Date of Birth: [birth year]  Subjective/Objective:    Left ankle pain                   Action/Plan: Discharge Planning: AVS reviewed: NCM spoke to pt and SO at bedside. Pt has crutches. States she will pick up RW from [place] store. Provided contact information for retail store on [address] Faxed RX to [facility] to have on file, pt was given a Rx for RW also.   Expected Discharge Date:  09/14/2016             Expected Discharge Plan:  Home/Self Care  In-House Referral:  NA  Discharge planning Services  CM Consult  Post Acute Care Choice:  NA Choice offered to:  NA  DME Arranged:  [facility] DME Agency:  [facility]. (pt will pick up at retail store)  [place] Arranged:  NA [place]:  NA  Status of Service:  Completed, signed off  If discussed at [place] of Stay Meetings, dates discussed:    Additional Comments:  Erenest Rasher, RN 09/14/2016, 12:38 PM

## 2017-08-27 ENCOUNTER — Encounter: Payer: Self-pay | Admitting: Neurology

## 2017-08-27 ENCOUNTER — Ambulatory Visit (INDEPENDENT_AMBULATORY_CARE_PROVIDER_SITE_OTHER): Payer: BLUE CROSS/BLUE SHIELD | Admitting: Neurology

## 2017-08-27 VITALS — BP 113/73 | HR 93 | Ht 67.0 in | Wt 226.0 lb

## 2017-08-27 DIAGNOSIS — R351 Nocturia: Secondary | ICD-10-CM

## 2017-08-27 DIAGNOSIS — E669 Obesity, unspecified: Secondary | ICD-10-CM

## 2017-08-27 DIAGNOSIS — G4726 Circadian rhythm sleep disorder, shift work type: Secondary | ICD-10-CM | POA: Diagnosis not present

## 2017-08-27 DIAGNOSIS — G4719 Other hypersomnia: Secondary | ICD-10-CM | POA: Diagnosis not present

## 2017-08-27 DIAGNOSIS — R51 Headache: Secondary | ICD-10-CM | POA: Diagnosis not present

## 2017-08-27 DIAGNOSIS — R519 Headache, unspecified: Secondary | ICD-10-CM

## 2017-08-27 DIAGNOSIS — Z9884 Bariatric surgery status: Secondary | ICD-10-CM | POA: Diagnosis not present

## 2017-08-27 NOTE — Patient Instructions (Addendum)
Thank you for choosing [facility] for your sleep related care! It was nice to meet you today! I appreciate that you entrust me with your sleep related healthcare concerns. I hope, I was able to address at least some of your concerns today, and that I can help you feel reassured and also get better.    Here is what we discussed today and what we came up with as our plan for you:    Based on your symptoms and your exam I believe you may still be at risk for obstructive sleep apnea or OSA, and I think we should proceed with a sleep study to determine whether you do or do not have OSA and how severe it is. If you have more than mild OSA, I want you to consider treatment with CPAP. Please remember, the risks and ramifications of moderate to severe obstructive sleep apnea or OSA are: Cardiovascular disease, including congestive heart failure, stroke, difficult to control hypertension, arrhythmias, and even type 2 diabetes has been linked to untreated OSA. Sleep apnea causes disruption of sleep and sleep deprivation in most cases, which, in turn, can cause recurrent headaches, problems with memory, mood, concentration, focus, and vigilance. Most people with untreated sleep apnea report excessive daytime sleepiness, which can affect their ability to drive. Please do not drive if you feel sleepy.   I will likely see you back after your sleep study to go over the test results and where to go from there. We will call you after your sleep study to advise about the results (most likely, you will hear from [place], my nurse) and to set up an appointment at the time, as necessary.    Our sleep lab administrative assistant will call you to schedule your sleep study. If you don't hear back from her by about 2 weeks from now, please feel free to call her at [phone]. You can leave a message with your phone number and concerns, if you get the voicemail box. She will call back as soon as possible.

## 2017-08-27 NOTE — Progress Notes (Signed)
Subjective:    Patient ID: Jocelyn Carpenter is a 47 y.o. female.  HPI    Star Age, MD, PhD [facility] [address], [address] [place], [place] [address]  Dear Jocelyn Carpenter,   Neurologic clinic today for initial consultation of her sleep disorder, in particular, evaluation of her obstructive sleep apnea. The patient is unaccompanied today. As you know, Jocelyn Carpenter is a 47 year old right-handed woman with an underlying medical history of hypertension, hyperthyroidism, and obesity, who was previously diagnosed with obstructive sleep apnea. She had a sleep study in January 2016 which showed no significant obstructive sleep apnea, AHI was 3.1 per report. I reviewed the report. She was noted to have moderate to loud snoring, no significant leg movements. Some difficulty maintaining sleep. Sleep latency was 8.5 minutes, REM latency delayed at 209 minutes, REM percentage was near normal at 18.4%, total sleep time 288 minutes. Patient reports that she did not feel comfortable during the sleep study and did not sleep very well. She reports that her snoring is still bothersome. She wakes up with a headache sometimes, she has nocturia. 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 Bedtime is around 8 AM and wakeup time around 2 PM. She works from Sunday night through Thursday night. She has a prior diagnosis of obstructive sleep apnea from before her weight loss surgery. She has an old CPAP machine. She does not use it. She had gastric bypass surgery in 2003. I reviewed your office note from 07/09/2017, which you kindly included. She suspects that her father and her brother may have sleep apnea. She lives with her wife and her 35 as well as stepdaughter's baby. She is a nonsmoker and drinks alcohol occasionally, typically only 1 glass of wine on weekends. She drinks caffeine in the form of coffee, one cup per day on average. She does have a  TV on in her bedroom and it tends to stay on. Her Epworth sleepiness score is 13 out of 24, fatigue score is 13 out of 63.  Her Past Medical History Is Significant For: Past Medical History:  Diagnosis Date  . Hypertension   . Hypothyroidism   . IUD 11/11/10   MIRENA IUD INSERTED    Her Past Surgical History Is Significant For: Past Surgical History:  Procedure Laterality Date  . BREAST SURGERY     REDUCTION MAMMAPLASTY  . BUNIONECTOMY     RIGHT AND LEFT  . GASTRIC BYPASS      Her Family History Is Significant For: Family History  Problem Relation Age of Onset  . Diabetes Father   . Hypertension Father     Her Social History Is Significant For: Social History   Socioeconomic History  . Marital status: Single    Spouse name: None  . Number of children: None  . Years of education: None  . Highest education level: None  Social Needs  . Financial resource strain: None  . Food insecurity - worry: None  . Food insecurity - inability: None  . Transportation needs - medical: None  . Transportation needs - non-medical: None  Occupational History  . None  Tobacco Use  . Smoking status: Never Smoker  . Smokeless tobacco: Never Used  Substance and Sexual Activity  . Alcohol use: Yes    Comment: glass a wine at night  . Drug use: No  . Sexual activity: None  Other Topics Concern  . None  Social History Narrative  . None    Her Allergies Are:  No Known Allergies:   Her Current Medications Are:  Outpatient Encounter Medications as of 08/27/2017  Medication Sig  . amLODipine (NORVASC) 10 MG tablet Take 10 mg by mouth daily.   . clonazePAM (KLONOPIN) 0.5 MG tablet Take 0.5 tablets by mouth daily.  Marland Kitchen FeFum-FePoly-FA-B Cmp-C-Biot (INTEGRA PLUS PO) Take 1 tablet by mouth daily.  . hydrochlorothiazide (HYDRODIURIL) 25 MG tablet Take 25 mg by mouth daily.  Marland Kitchen levothyroxine (SYNTHROID, LEVOTHROID) 150 MCG tablet Take 175 mcg by mouth daily.    . phentermine (ADIPEX-P) 37.5  MG tablet Take 37.5 mg by mouth daily.  . norethindrone-ethinyl estradiol (MICROGESTIN,JUNEL,LOESTRIN) 1-20 MG-MCG tablet Take 1 tablet by mouth daily.  . [DISCONTINUED] HYDROcodone-acetaminophen (NORCO/VICODIN) 5-325 MG tablet Take 1-2 tablets by mouth every 4 (four) hours as needed for moderate pain or severe pain.   No facility-administered encounter medications on file as of 08/27/2017.   :  Review of Systems:  Out of a complete 14 point review of systems, all are reviewed and negative with the exception of these symptoms as listed below: Review of Systems  Neurological:       Pt presents today to discuss her sleep. Pt had a sleep study in 2016, has an old cpap at home, and does endorse snoring.   Epworth Sleepiness Scale 0= would never doze 1= slight chance of dozing 2= moderate chance of dozing 3= high chance of dozing  Sitting and reading: 3 Watching TV: 3 Sitting inactive in a public place (ex. Theater or meeting): 1 As a passenger in a car for an hour without a break: 0 Lying down to rest in the afternoon: 3 Sitting and talking to someone: 1 Sitting quietly after lunch (no alcohol): 2 In a car, while stopped in traffic: 0 Total: 13     Objective:  Neurological Exam  Physical Exam Physical Examination:   Vitals:   08/27/17 1620  BP: 113/73  Pulse: 93    General Examination: The patient is a very pleasant 47 y.o. female in no acute distress. She appears well-developed and well-nourished and well groomed.   HEENT: Normocephalic, atraumatic, pupils are equal, round and reactive to light and accommodation. Corrective contact lenses in place. Extraocular tracking is good without limitation to gaze excursion or nystagmus noted. Normal smooth pursuit is noted. Hearing is grossly intact. Face is symmetric with normal facial animation and normal facial sensation. Speech is clear with no dysarthria noted. There is no hypophonia. There is no lip, neck/head, jaw or voice  tremor. Neck is supple with full range of passive and active motion. There are no carotid bruits on auscultation. Oropharynx exam reveals: mild mouth dryness, adequate dental hygiene and moderate airway crowding, due to smaller airway entry and redundant soft palate, tonsils are 1+. Tongue protrudes centrally and palate elevates symmetrically, Mallampati is class I. Neck circumference is 14-1/2 inches.  Chest: Clear to auscultation without wheezing, rhonchi or crackles noted.  Heart: S1+S2+0, regular and normal without murmurs, rubs or gallops noted.   Abdomen: Soft, non-tender and non-distended with normal bowel sounds appreciated on auscultation.  Extremities: There is no pitting edema in the distal lower extremities bilaterally. Pedal pulses are intact.  Skin: Warm and dry without trophic changes noted.  Musculoskeletal: exam reveals no obvious joint deformities, tenderness or joint swelling or erythema.   Neurologically:  Mental status: The patient is awake, alert and oriented in all 4 spheres. Her immediate and remote  memory, attention, language skills and fund of knowledge are appropriate. There is no evidence of aphasia, agnosia, apraxia or anomia. Speech is clear with normal prosody and enunciation. Thought process is linear. Mood is normal and affect is normal.  Cranial nerves II - XII are as described above under HEENT exam. In addition: shoulder shrug is normal with equal shoulder height noted. Motor exam: Normal bulk, strength and tone is noted. There is no drift, tremor or rebound. Romberg is negative. Reflexes are 2+ throughout. Fine motor skills and coordination: intact with normal finger taps, normal hand movements, normal rapid alternating patting, normal foot taps and normal foot agility.  Cerebellar testing: No dysmetria or intention tremor on finger to nose testing. Heel to shin is unremarkable bilaterally. There is no truncal or gait ataxia.  Sensory exam: intact to light  touch in the upper and lower extremities.  Gait, station and balance: She stands easily. No veering to one side is noted. No leaning to one side is noted. Posture is age-appropriate and stance is narrow based. Gait shows normal stride length and normal pace. No problems turning are noted.        Assessment and Plan:  In summary, Dayanne T Shoaf is a very pleasant 47 y.o. female with an underlying medical history of hypertension, hyperthyroidism, and obesity, who was previously diagnosed with obstructive sleep apnea and placed on CPAP therapy. She then had weight loss surgery in 2003. She reports nonrestorative sleep, headaches upon awakening, sleepiness and nocturia. Complicating factor is her shift work and her difficulty maintaining a schedule. She reports on weekends she tends to sleep off and on throughout the day. We will try to get her in for a daytime sleep study as she would prefer this over a night time sleep study. I would recommend we bring her in for sleep study for reevaluation.  I had a long chat with the patient about my findings and the diagnosis of OSA, its prognosis and treatment options. We talked about medical treatments, surgical interventions and non-pharmacological approaches. I explained in particular the risks and ramifications of untreated moderate to severe OSA, especially with respect to developing cardiovascular disease down the Road, including congestive heart failure, difficult to treat hypertension, cardiac arrhythmias, or stroke. Even type 2 diabetes has, in part, been linked to untreated OSA. Symptoms of untreated OSA include daytime sleepiness, memory problems, mood irritability and mood disorder such as depression and anxiety, lack of energy, as well as recurrent headaches, especially morning headaches. We talked about trying to maintain a healthy lifestyle in general, as well as the importance of weight control. I encouraged the patient to eat healthy, exercise daily and  keep well hydrated, to keep a scheduled bedtime and wake time routine, to not skip any meals and eat healthy snacks in between meals. I advised the patient not to drive when feeling sleepy. I recommended the following at this time: sleep study with potential positive airway pressure titration. (We will score hypopneas at 3%).   I explained the sleep test procedure to the patient and also outlined possible surgical and non-surgical treatment options of OSA, including the use of a custom-made dental device (which would require a referral to a specialist dentist or oral surgeon), upper airway surgical options, such as pillar implants, radiofrequency surgery, tongue base surgery, and UPPP (which would involve a referral to an ENT surgeon). Rarely, jaw surgery such as mandibular advancement may be considered.  I also explained the CPAP treatment option to the patient,  who indicated that she would be willing to try CPAP if the need arises. I explained the importance of being compliant with PAP treatment, not only for insurance purposes but primarily to improve Her symptoms, and for the patient's long term health benefit, including to reduce Her cardiovascular risks. I answered all her questions today and the patient was in agreement. I would like to see her back after the sleep study is completed and encouraged her to call with any interim questions, concerns, problems or updates.   Thank you very much for allowing me to participate in the care of this nice patient. If I can be of any further assistance to you please do not hesitate to call me at [phone].  Sincerely,   Star Age, MD, PhD

## 2017-10-10 ENCOUNTER — Other Ambulatory Visit: Payer: Self-pay

## 2017-10-10 ENCOUNTER — Encounter (HOSPITAL_COMMUNITY): Payer: Self-pay | Admitting: *Deleted

## 2017-10-10 ENCOUNTER — Ambulatory Visit (HOSPITAL_COMMUNITY)
Admission: EM | Admit: 2017-10-10 | Discharge: 2017-10-10 | Disposition: A | Discharge status: Home / Self Care | Payer: BLUE CROSS/BLUE SHIELD | Attending: Family Medicine | Admitting: Family Medicine

## 2017-10-10 DIAGNOSIS — R5383 Other fatigue: Secondary | ICD-10-CM | POA: Diagnosis present

## 2017-10-10 DIAGNOSIS — Z79899 Other long term (current) drug therapy: Secondary | ICD-10-CM | POA: Diagnosis not present

## 2017-10-10 DIAGNOSIS — R05 Cough: Secondary | ICD-10-CM | POA: Diagnosis present

## 2017-10-10 DIAGNOSIS — E039 Hypothyroidism, unspecified: Secondary | ICD-10-CM | POA: Diagnosis not present

## 2017-10-10 DIAGNOSIS — J40 Bronchitis, not specified as acute or chronic: Secondary | ICD-10-CM | POA: Diagnosis not present

## 2017-10-10 DIAGNOSIS — I10 Essential (primary) hypertension: Secondary | ICD-10-CM | POA: Insufficient documentation

## 2017-10-10 LAB — POCT RAPID STREP A: Streptococcus, Group A Screen (Direct): NEGATIVE

## 2017-10-10 MED ORDER — HYDROCODONE-HOMATROPINE 5-1.5 MG/5ML PO SYRP: 5.0000 mL | ORAL_SOLUTION | Freq: Four times a day (QID) | ORAL | 0 refills | Status: DC | PRN

## 2017-10-10 MED ORDER — AZITHROMYCIN 250 MG PO TABS: 250.0000 mg | ORAL_TABLET | Freq: Every day | ORAL | 0 refills | Status: AC

## 2017-10-10 MED ORDER — PREDNISONE 20 MG PO TABS: ORAL_TABLET | ORAL | 0 refills | Status: AC

## 2017-10-10 MED ORDER — HYDROCODONE-HOMATROPINE 5-1.5 MG/5ML PO SYRP: 5.0000 mL | ORAL_SOLUTION | Freq: Four times a day (QID) | ORAL | 0 refills | Status: AC | PRN

## 2017-10-10 NOTE — Discharge Instructions (Addendum)
Return in 3 days if symptoms are not significantly improved.

## 2017-10-10 NOTE — ED Triage Notes (Signed)
Cough, fatigue, chills, voice gone, body aches

## 2017-10-10 NOTE — ED Provider Notes (Signed)
[facility]   [id number] 10/10/17 Arrival Time: 1816   SUBJECTIVE:  Jocelyn Carpenter is a 47 y.o. female who presents to the urgent care with complaint of Cough, fatigue, chills, voice gone, body aches  Past Medical History:  Diagnosis Date  . Hypertension   . Hypothyroidism   . IUD 11/11/10   MIRENA IUD INSERTED   Family History  Problem Relation Age of Onset  . Diabetes Father   . Hypertension Father    Social History   Socioeconomic History  . Marital status: Single    Spouse name: Not on file  . Number of children: Not on file  . Years of education: Not on file  . Highest education level: Not on file  Occupational History  . Not on file  Social Needs  . Financial resource strain: Not on file  . Food insecurity:    Worry: Not on file    Inability: Not on file  . Transportation needs:    Medical: Not on file    Non-medical: Not on file  Tobacco Use  . Smoking status: Never Smoker  . Smokeless tobacco: Never Used  Substance and Sexual Activity  . Alcohol use: Yes    Comment: glass a wine at night  . Drug use: No  . Sexual activity: Not on file  Lifestyle  . Physical activity:    Days per week: Not on file    Minutes per session: Not on file  . Stress: Not on file  Relationships  . Social connections:    Talks on phone: Not on file    Gets together: Not on file    Attends religious service: Not on file    Active member of club or organization: Not on file    Attends meetings of clubs or organizations: Not on file    Relationship status: Not on file  . Intimate partner violence:    Fear of current or ex partner: Not on file    Emotionally abused: Not on file    Physically abused: Not on file    Forced sexual activity: Not on file  Other Topics Concern  . Not on file  Social History Narrative  . Not on file   Current Meds  Medication Sig  . amLODipine (NORVASC) 10 MG tablet Take 10 mg by mouth daily.   . clonazePAM (KLONOPIN) 0.5 MG  tablet Take 0.5 tablets by mouth daily.  Marland Kitchen FeFum-FePoly-FA-B Cmp-C-Biot (INTEGRA PLUS PO) Take 1 tablet by mouth daily.  . hydrochlorothiazide (HYDRODIURIL) 25 MG tablet Take 25 mg by mouth daily.  Marland Kitchen levothyroxine (SYNTHROID, LEVOTHROID) 150 MCG tablet Take 175 mcg by mouth daily.    . phentermine (ADIPEX-P) 37.5 MG tablet Take 37.5 mg by mouth daily.   No Known Allergies    ROS: As per HPI, remainder of ROS negative.   OBJECTIVE:   Vitals:   10/10/17 1824  BP: 125/84  Pulse: 82  Temp: 98.2 F (36.8 C)  TempSrc: Oral  SpO2: 98%     General appearance: alert; no distress Eyes: PERRL; EOMI; conjunctiva normal HENT: normocephalic; atraumatic; TMs normal, canal normal, external ears normal without trauma; nasal mucosa normal; oral mucosa normal Neck: supple Lungs: ronchi and basilar rales on auscultation bilaterally Heart: regular rate and rhythm Back: no CVA tenderness Extremities: no cyanosis or edema; symmetrical with no gross deformities Skin: warm and dry Neurologic: normal gait; grossly normal Psychological: alert and cooperative; normal mood and affect      Labs:  No results found for this or any previous visit.  Labs Reviewed - No data to display  No results found.     ASSESSMENT & PLAN:  1. Bronchitis     Meds ordered this encounter  Medications  . azithromycin (ZITHROMAX) 250 MG tablet    Sig: Take 1 tablet (250 mg total) by mouth daily. Take first 2 tablets together, then 1 every day until finished.    Dispense:  6 tablet    Refill:  0  . DISCONTD: HYDROcodone-homatropine (HYDROMET) 5-1.5 MG/5ML syrup    Sig: Take 5 mLs by mouth every 6 (six) hours as needed for cough.    Dispense:  60 mL    Refill:  0  . predniSONE (DELTASONE) 20 MG tablet    Sig: Two daily with food    Dispense:  10 tablet    Refill:  0  . HYDROcodone-homatropine (HYDROMET) 5-1.5 MG/5ML syrup    Sig: Take 5 mLs by mouth every 6 (six) hours as needed for cough.     Dispense:  60 mL    Refill:  0    Reviewed expectations re: course of current medical issues. Questions answered. Outlined signs and symptoms indicating need for more acute intervention. Patient verbalized understanding. After Visit Summary given.    Procedures:      Robyn Haber, MD 10/10/17 [phone]

## 2017-10-13 LAB — CULTURE, GROUP A STREP (THRC)

## 2017-12-03 ENCOUNTER — Ambulatory Visit (INDEPENDENT_AMBULATORY_CARE_PROVIDER_SITE_OTHER): Payer: BLUE CROSS/BLUE SHIELD | Admitting: Neurology

## 2017-12-03 DIAGNOSIS — R351 Nocturia: Secondary | ICD-10-CM

## 2017-12-03 DIAGNOSIS — G4726 Circadian rhythm sleep disorder, shift work type: Secondary | ICD-10-CM

## 2017-12-03 DIAGNOSIS — R51 Headache: Secondary | ICD-10-CM

## 2017-12-03 DIAGNOSIS — Z9884 Bariatric surgery status: Secondary | ICD-10-CM

## 2017-12-03 DIAGNOSIS — G4719 Other hypersomnia: Secondary | ICD-10-CM

## 2017-12-03 DIAGNOSIS — E669 Obesity, unspecified: Secondary | ICD-10-CM

## 2017-12-03 DIAGNOSIS — G4720 Circadian rhythm sleep disorder, unspecified type: Secondary | ICD-10-CM

## 2017-12-03 DIAGNOSIS — R519 Headache, unspecified: Secondary | ICD-10-CM

## 2017-12-03 DIAGNOSIS — G4710 Hypersomnia, unspecified: Secondary | ICD-10-CM | POA: Diagnosis not present

## 2017-12-03 DIAGNOSIS — R0683 Snoring: Secondary | ICD-10-CM

## 2017-12-07 ENCOUNTER — Telehealth: Payer: Self-pay

## 2017-12-07 NOTE — Progress Notes (Signed)
Patient referred by Eldridge Abrahams, NP, seen by me on 08/27/17, diagnostic PSG on 12/03/17.   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 Only mild snoring was noted; for disturbing snoring, an oral appliance (through a qualified dentist) can be considered.  Please remind patient to try to maintain good sleep hygiene, which means: Keep a regular sleep and wake schedule and make enough time for sleep (7 1/2 to 8 1/2 hours for the average adult), try not to exercise or have a meal within 2 hours of your bedtime, try to keep your bedroom conducive for sleep, that is, cool and dark, without light distractors such as an illuminated alarm clock, and refrain from watching TV right before sleep or in the middle of the night and do not keep the TV or radio on during the night. If a nightlight is used, have it away from the visual field. Also, try not to use or play on electronic devices at bedtime, such as your cell phone, tablet PC or laptop. If you like to read at bedtime on an electronic device, try to dim the background light as much as possible. Do not eat in the middle of the night. Keep pets away from the bedroom environment. For stress relief, try meditation, deep breathing exercises (there are many books and CDs available), a white noise machine or fan can help to diffuse other noise distractors, such as traffic noise. Do not drink alcohol before bedtime, as it can disturb sleep and cause middle of the night awakenings. Never mix alcohol and sedating medications! Avoid narcotic pain medication close to bedtime, as opioids/narcotics can suppress breathing drive and breathing effort.   If switching her schedule to daytime is a possibility, it may help her sleep disruption.  She can FU with PCP at this point.   Star Age, MD, PhD [facility])

## 2017-12-07 NOTE — Telephone Encounter (Signed)
I called pt. I advised pt that Dr. Rexene Alberts reviewed pt's sleep study and found that did not show any significant osa. Dr. Rexene Alberts recommends that pt follow up with a dentist to discuss an oral appliance regarding her mild snoring, if that is bothersome to the pt, and also follow up with her PCP. I also advised pt that it may be beneficial to her sleep disruption if pt could switch her schedule to be working during the daytime. I reviewed sleep hygiene recommendations with the pt, including trying to keep a regular sleep wake schedule, avoiding electronics in the bedroom, keeping the bedroom cool, dark, and quiet, and avoiding eating or exercising within 2 hours of bedtime as well as eating in the middle of the night. I advised pt to keep pets out of the bedroom. I discussed with pt the importance of stress relief and to try meditation, deep breathing exercises, and/or a white noise machine or fan to diffuse other noise distractors. I advised pt to not drink alcohol before bedtime and to never mix alcohol and sedating medications. Pt was advised to avoid narcotic pain medication close to bedtime. I advised pt that a copy of these sleep study results will be sent to Marcell Anger, NP. Pt verbalized understanding of results. Pt had no questions at this time but was encouraged to call back if questions arise.

## 2017-12-07 NOTE — Procedures (Signed)
PATIENT'S NAME:  Jocelyn Carpenter, Jocelyn Carpenter DOB:      November 16, 1970      MR#:    [id number]     DATE OF RECORDING: 12/03/2017 REFERRING M.D.:  Eldridge Abrahams, NP Study Performed:   Baseline Polysomnogram HISTORY: 47 year old woman with a history of hypertension, hyperthyroidism, and obesity, who was previously diagnosed with obstructive sleep apnea. She had a sleep study in January 2016 which showed no significant obstructive sleep apnea. She works nights as a Administrator. She reports that her snoring is still bothersome. She had gastric bypass surgery in 2003. The patient endorsed the Epworth Sleepiness Scale at 13 points. The patient's weight 226 pounds with a height of 67 (inches), resulting in a BMI of 35.6 kg/m2. The patient's neck circumference measured 14.5 inches.  CURRENT MEDICATIONS: Norvasc, Klonopin, Integra Plus, Hydrodiuril, Synthroid, Adipex, Microgestin   PROCEDURE:  This is a multichannel digital polysomnogram utilizing the Somnostar 11.2 system.  Electrodes and sensors were applied and monitored per AASM Specifications.   EEG, EOG, Chin and Limb EMG, were sampled at 200 Hz.  ECG, Snore and Nasal Pressure, Thermal Airflow, Respiratory Effort, CPAP Flow and Pressure, Oximetry was sampled at 50 Hz. Digital video and audio were recorded.      BASELINE STUDY  The patient had a daytime sleep study to accommodate her schedule. Lights Out was at 09:00 and Lights On at 15:06.  Total recording time (TRT) was 367 minutes, with a total sleep time (TST) of  274.5 minutes.   The patient's sleep latency was 35.5 minutes.  REM latency was 9.5 minutes, which is reduced. The sleep efficiency was 74.8%.     SLEEP ARCHITECTURE: WASO (Wake after sleep onset) was 53 minutes with mild to moderate sleep fragmentation noted. There were 21 minutes in Stage N1, 205.5 minutes Stage N2, 17.5 minutes Stage N3 and 30.5 minutes in Stage REM.  The percentage of Stage N1 was 7.7%, Stage N2 was 74.9%, which is increased, Stage N3 was  6.4%, which is reduced, and Stage R (REM sleep) was 11.1%, which is reduced. The arousals were noted as: 92 were spontaneous, 0 were associated with PLMs, 17 were associated with respiratory events.  RESPIRATORY ANALYSIS:  There were a total of 17 respiratory events:  3 obstructive apneas, 0 central apneas and 0 mixed apneas with a total of 3 apneas and an apnea index (AI) of .7 /hour. There were 14 hypopneas with a hypopnea index of 3.1 /hour. The patient also had 6 respiratory event related arousals (RERAs).      The total APNEA/HYPOPNEA INDEX (AHI) was 3.7/hour and the total RESPIRATORY DISTURBANCE INDEX was 5.0 /hour.  1 events occurred in REM sleep and 26 events in NREM. The REM AHI was 2.0 /hour, versus a non-REM AHI of 3.9. The patient spent 89.5 minutes of total sleep time in the supine position and 185 minutes in non-supine. The supine AHI was 4.7 versus a non-supine AHI of 3.2.  OXYGEN SATURATION & C02:  The Wake baseline 02 saturation was 98%, with the lowest being 91%. Time spent below 89% saturation equaled 0 minutes. PERIODIC LIMB MOVEMENTS:   The patient had a total of 0 Periodic Limb Movements.  The Periodic Limb Movement (PLM) index was 0 and the PLM Arousal index was 0/hour.  Audio and video analysis did not show any abnormal or unusual movements, behaviors, phonations or vocalizations. The patient took 1 bathroom break. Mild snoring was noted. The EKG was in keeping with normal sinus rhythm (NSR).  Post-study, the patient indicated that sleep was the same as usual.   IMPRESSION:  1. Primary Snoring 2. Dysfunctions associated with Sleep stages or arousal from sleep  RECOMMENDATIONS:  1. This study does not demonstrate any significant obstructive or central sleep disordered breathing. Only mild snoring was noted; for disturbing snoring, an oral appliance (through a qualified dentist) can be considered.  2. This study does not support an intrinsic sleep disorder as a cause of the  patient's symptoms. Other causes, including circadian rhythm disturbances, an underlying mood disorder, medication effect and/or an underlying medical problem cannot be ruled out. 3. This study shows sleep fragmentation and abnormal sleep stage percentages; these are nonspecific findings and per se do not signify an intrinsic sleep disorder or a cause for the patient's sleep-related symptoms. Causes include (but are not limited to) the first night effect of the sleep study, circadian rhythm disturbances, medication effect or an underlying mood disorder or medical problem.  4. The patient should be cautioned not to drive, work at heights, or operate dangerous or heavy equipment when tired or sleepy. Review and reiteration of good sleep hygiene measures should be pursued with any patient. 5. The patient can follow-up with her referring provider, who will be notified of the test results.  I certify that I have reviewed the entire raw data recording prior to the issuance of this report in accordance with the Standards of Accreditation of the American Academy of Sleep Medicine (AASM)   Star Age, MD,PhD Diplomat, American Board of Psychiatry and Neurology (Neurology and Sleep Medicine)

## 2017-12-07 NOTE — Telephone Encounter (Signed)
-----   Message from Star Age, MD sent at 12/07/2017  8:20 AM EDT ----- Patient referred by Eldridge Abrahams, NP, seen by me on 08/27/17, diagnostic PSG on 12/03/17.   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 Only mild snoring was noted; for disturbing snoring, an oral appliance (through a qualified dentist) can be considered.  Please remind patient to try to maintain good sleep hygiene, which means: Keep a regular sleep and wake schedule and make enough time for sleep (7 1/2 to 8 1/2 hours for the average adult), try not to exercise or have a meal within 2 hours of your bedtime, try to keep your bedroom conducive for sleep, that is, cool and dark, without light distractors such as an illuminated alarm clock, and refrain from watching TV right before sleep or in the middle of the night and do not keep the TV or radio on during the night. If a nightlight is used, have it away from the visual field. Also, try not to use or play on electronic devices at bedtime, such as your cell phone, tablet PC or laptop. If you like to read at bedtime on an electronic device, try to dim the background light as much as possible. Do not eat in the middle of the night. Keep pets away from the bedroom environment. For stress relief, try meditation, deep breathing exercises (there are many books and CDs available), a white noise machine or fan can help to diffuse other noise distractors, such as traffic noise. Do not drink alcohol before bedtime, as it can disturb sleep and cause middle of the night awakenings. Never mix alcohol and sedating medications! Avoid narcotic pain medication close to bedtime, as opioids/narcotics can suppress breathing drive and breathing effort.   If switching her schedule to daytime is a possibility, it may help her sleep disruption.  She can FU with PCP at this point.   Star Age, MD, PhD [facility])

## 2018-03-04 IMAGING — CT CT KNEE*L* W/O CM
3 series · 16 of 33 positions shown, 19 images · non-contrast
Comparison: 09/14/2016 radiographs

CLINICAL DATA: 46-year-old female with acute left knee pain
following twisting injury yesterday. Initial encounter.

EXAM:
CT OF THE LEFT KNEE WITHOUT CONTRAST
TECHNIQUE: Multidetector CT imaging of the LEFT knee was performed according to
the standard protocol. Multiplanar CT image reconstructions were
also generated.

[Series 6: axial st · axial · 0.39mm/px · z∈[-219,-51]mm · 8 of 134 slices shown, 10 images]
[im 11/134  soft-tissue]
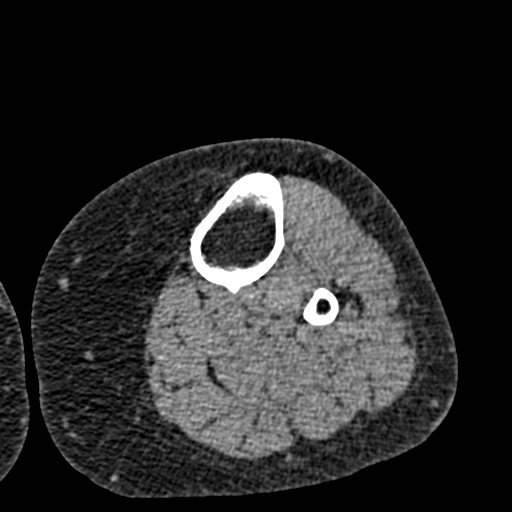
[im 11/134  bone]
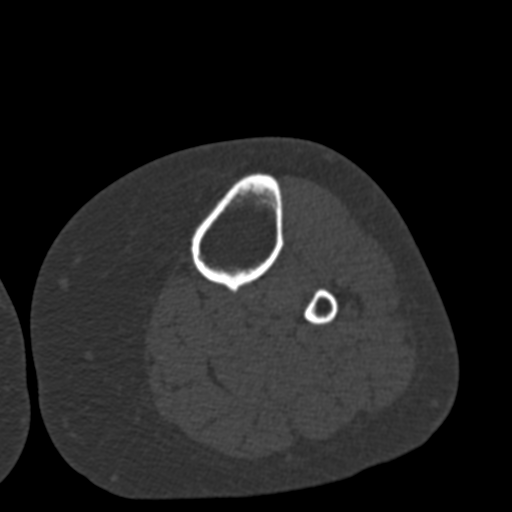
[im 31/134  bone]
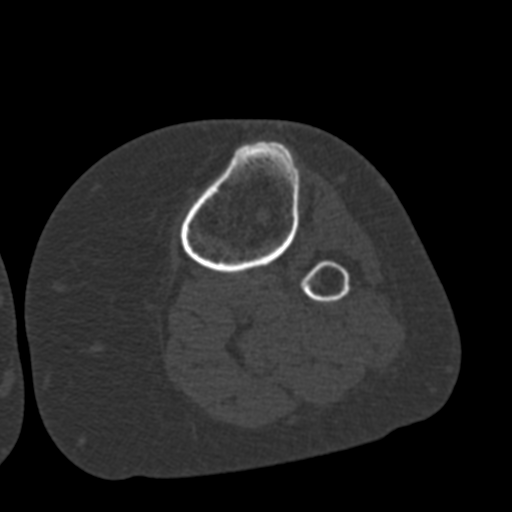
[im 41/134  bone]
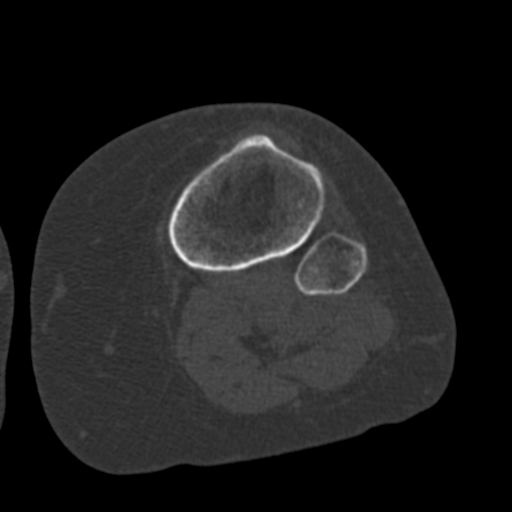
[im 62/134  bone]
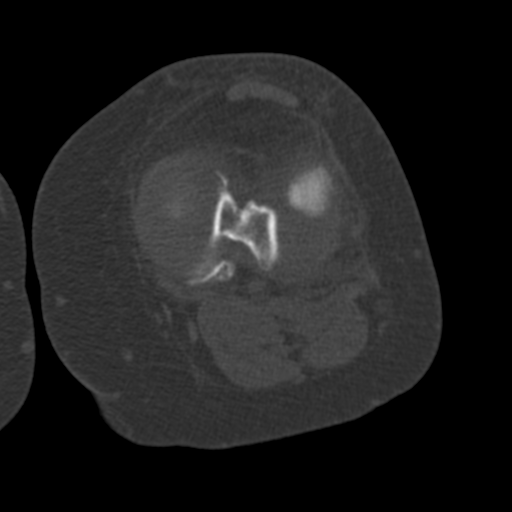
[im 72/134  soft-tissue]
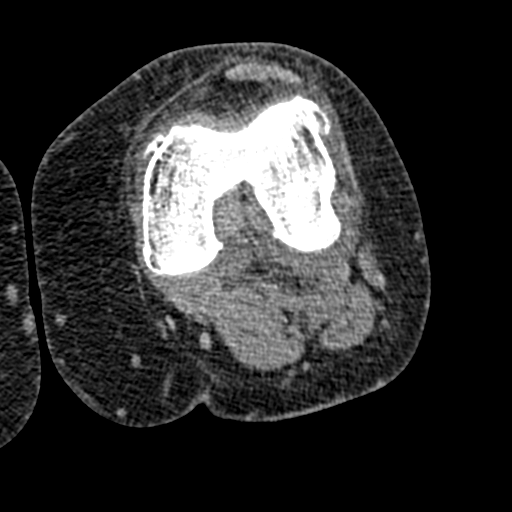
[im 72/134  bone]
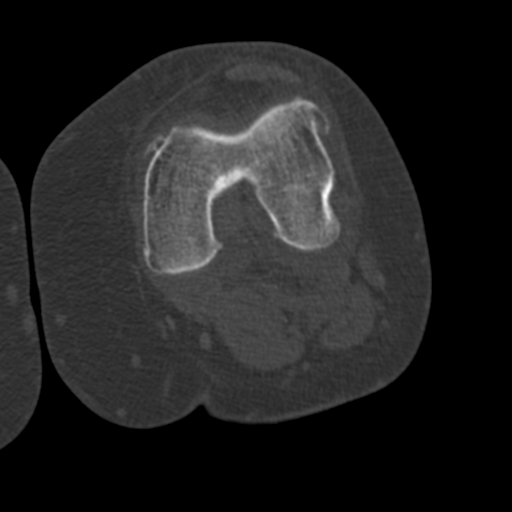
[im 93/134  bone]
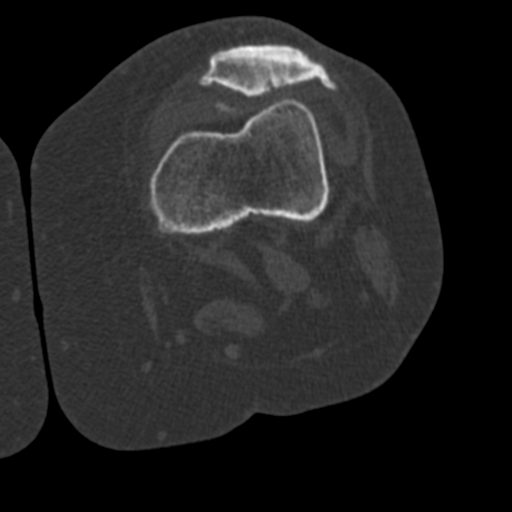
[im 103/134  bone]
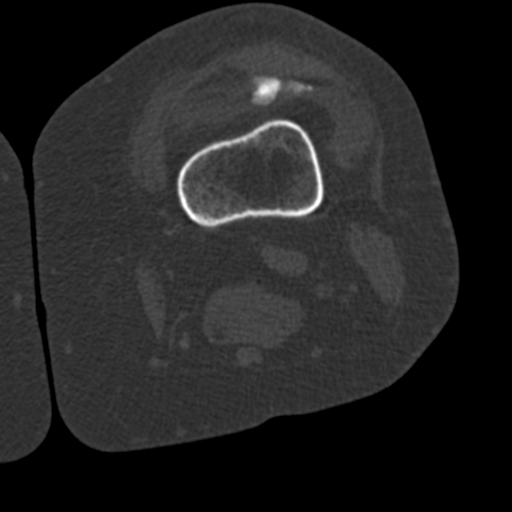
[im 123/134  bone]
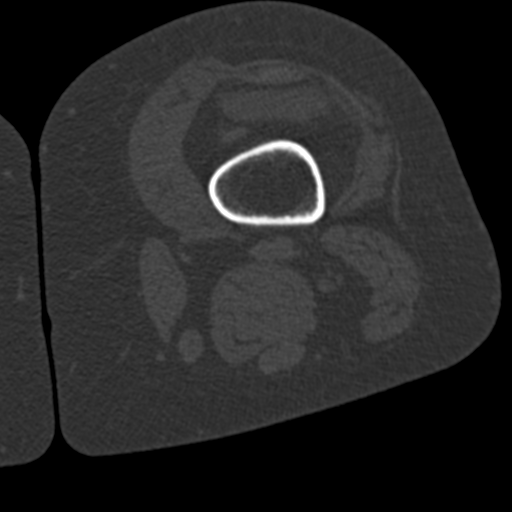

[Series 9: coronal st · coronal · 0.34mm/px · 3 of 111 slices shown]
[im 23/111  bone]
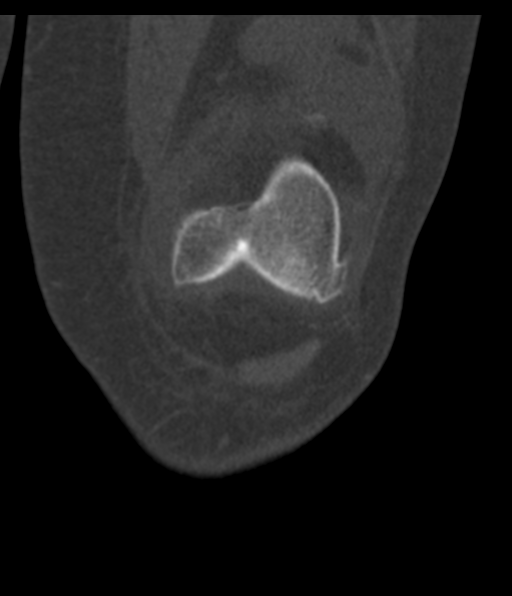
[im 45/111  bone]
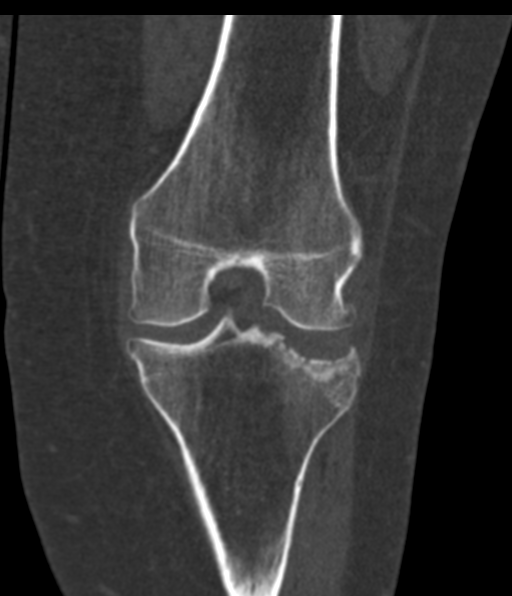
[im 67/111  bone]
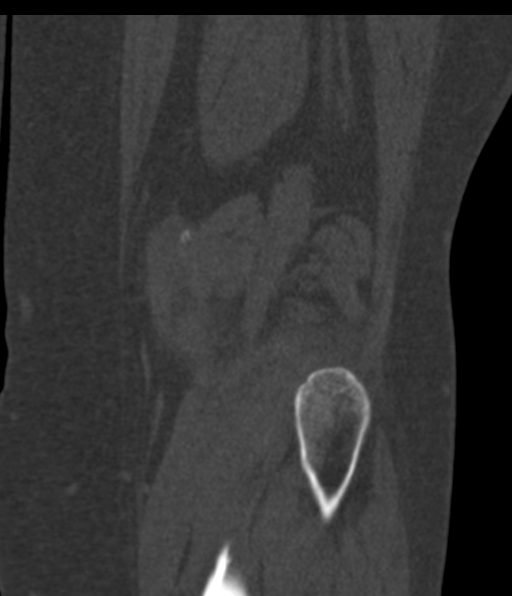

[Series 10: sagittal st · sagittal · 0.38mm/px · 5 of 108 slices shown, 6 images]
[im 36/108  bone]
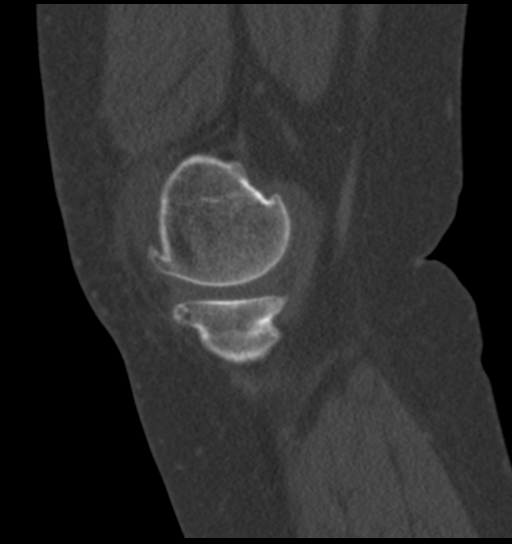
[im 45/108  bone]
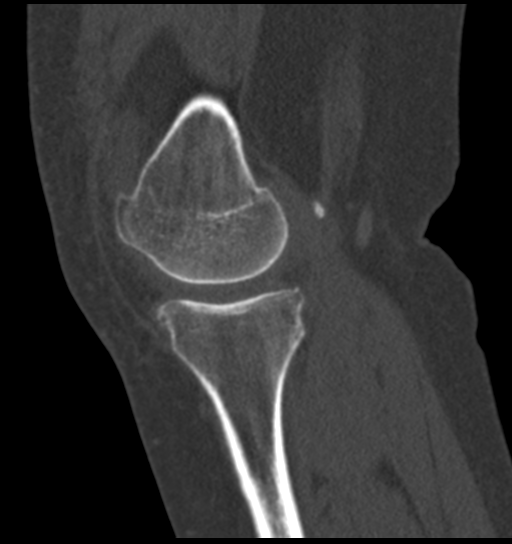
[im 54/108  soft-tissue]
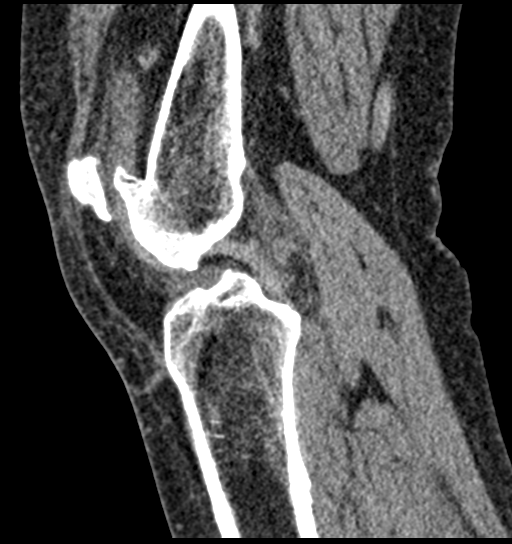
[im 54/108  bone]
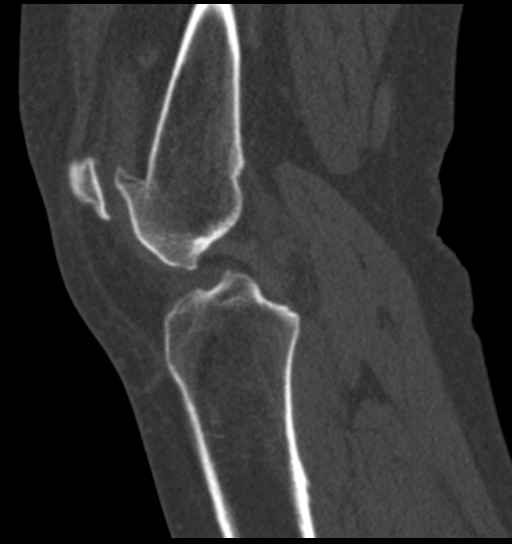
[im 63/108  bone]
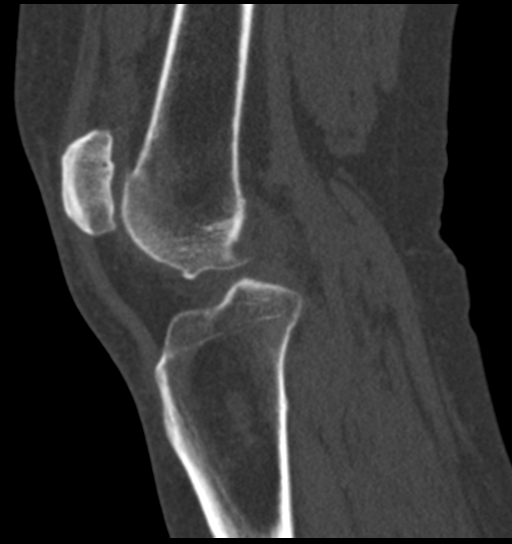
[im 72/108  bone]
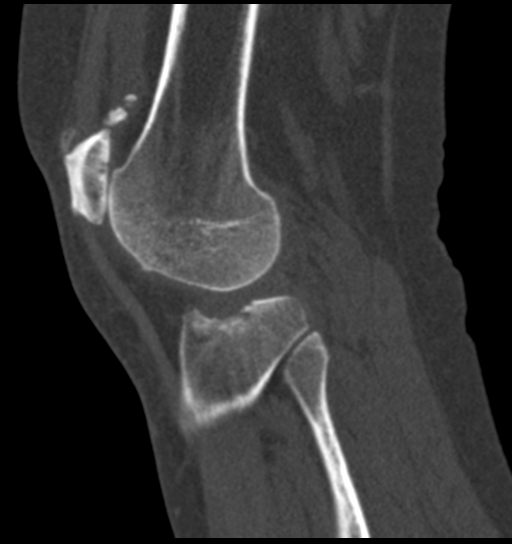

[16 of 33 positions shown; findings below may reference images not displayed]

FINDINGS: A lateral tibial plateau fracture is identified with 2.5 mm
depression. No split identified.

No other fracture, subluxation or dislocation identified.

A small moderate knee effusion is present with several calcified
bodies in the suprapatellar bursa.

Moderate tricompartmental degenerative changes are noted manifested
by joint space loss and osteophytosis.

No other significant abnormalities identified.
IMPRESSION: Lateral tibial plateau fracture with 2.5 mm depression.

Small to moderate knee effusion with calcified loose bodies in the
suprapatellar bursa.

Moderate tricompartmental degenerative changes.

## 2018-03-04 IMAGING — CR DG TIBIA/FIBULA 2V*L*
4 series · 4 of 4 positions shown · non-contrast
Comparison: None.

CLINICAL DATA: 46-year-old female with left ankle twisting and
pain.

EXAM:
LEFT FOOT - COMPLETE 3+ VIEW; LEFT TIBIA AND FIBULA - 2 VIEW

[x tib-fib ap left (1 of 2)]
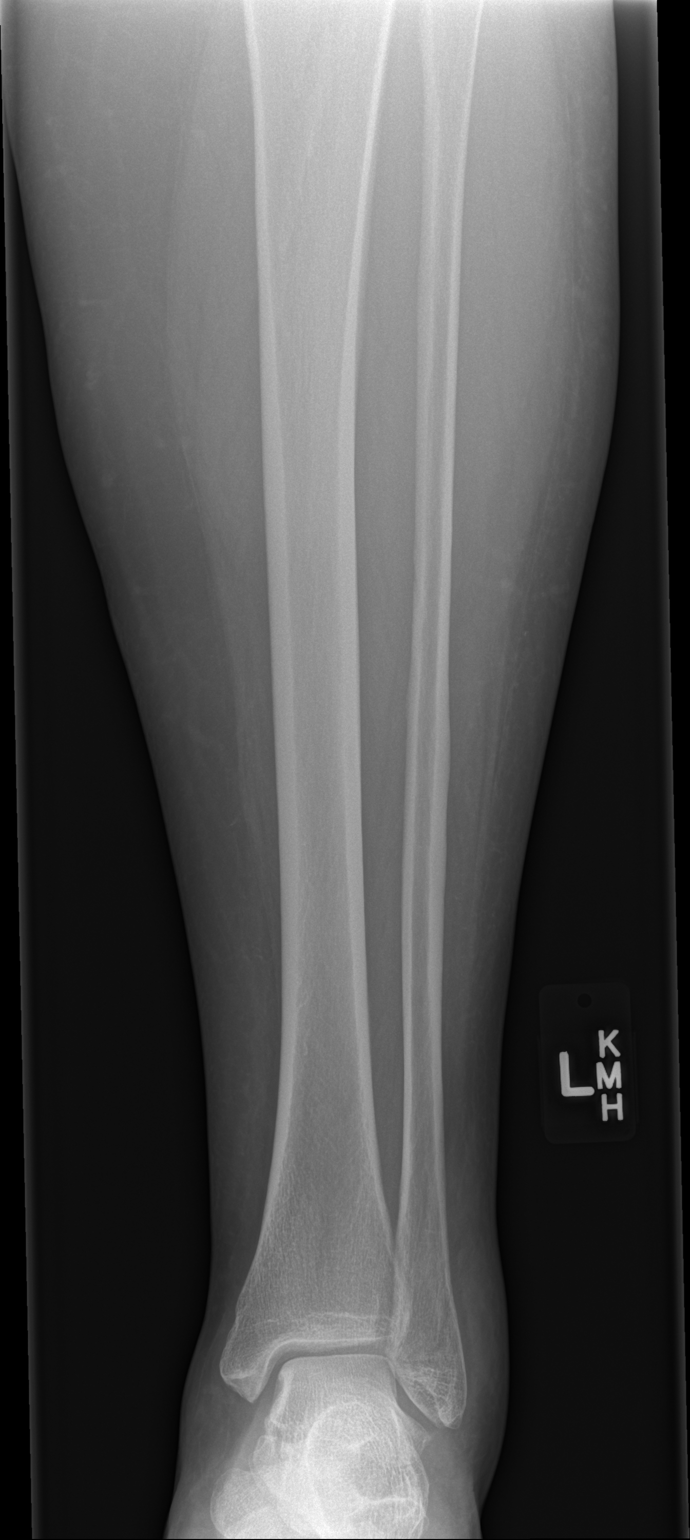

[x tib-fib ap left (2 of 2)]
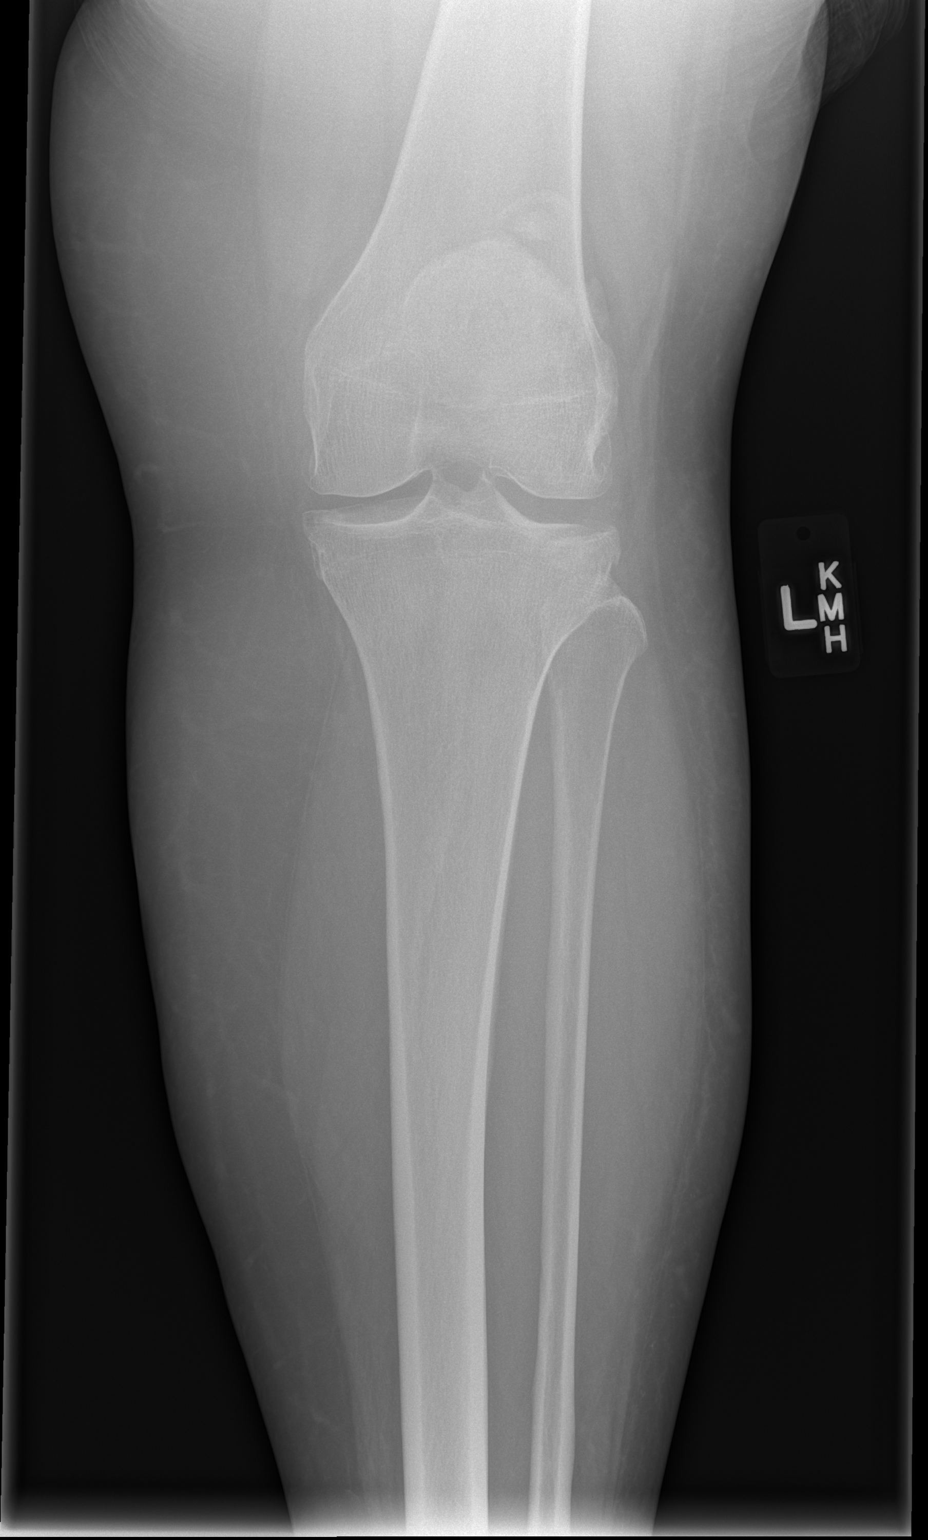

[x tib-fib lat left (1 of 2)]
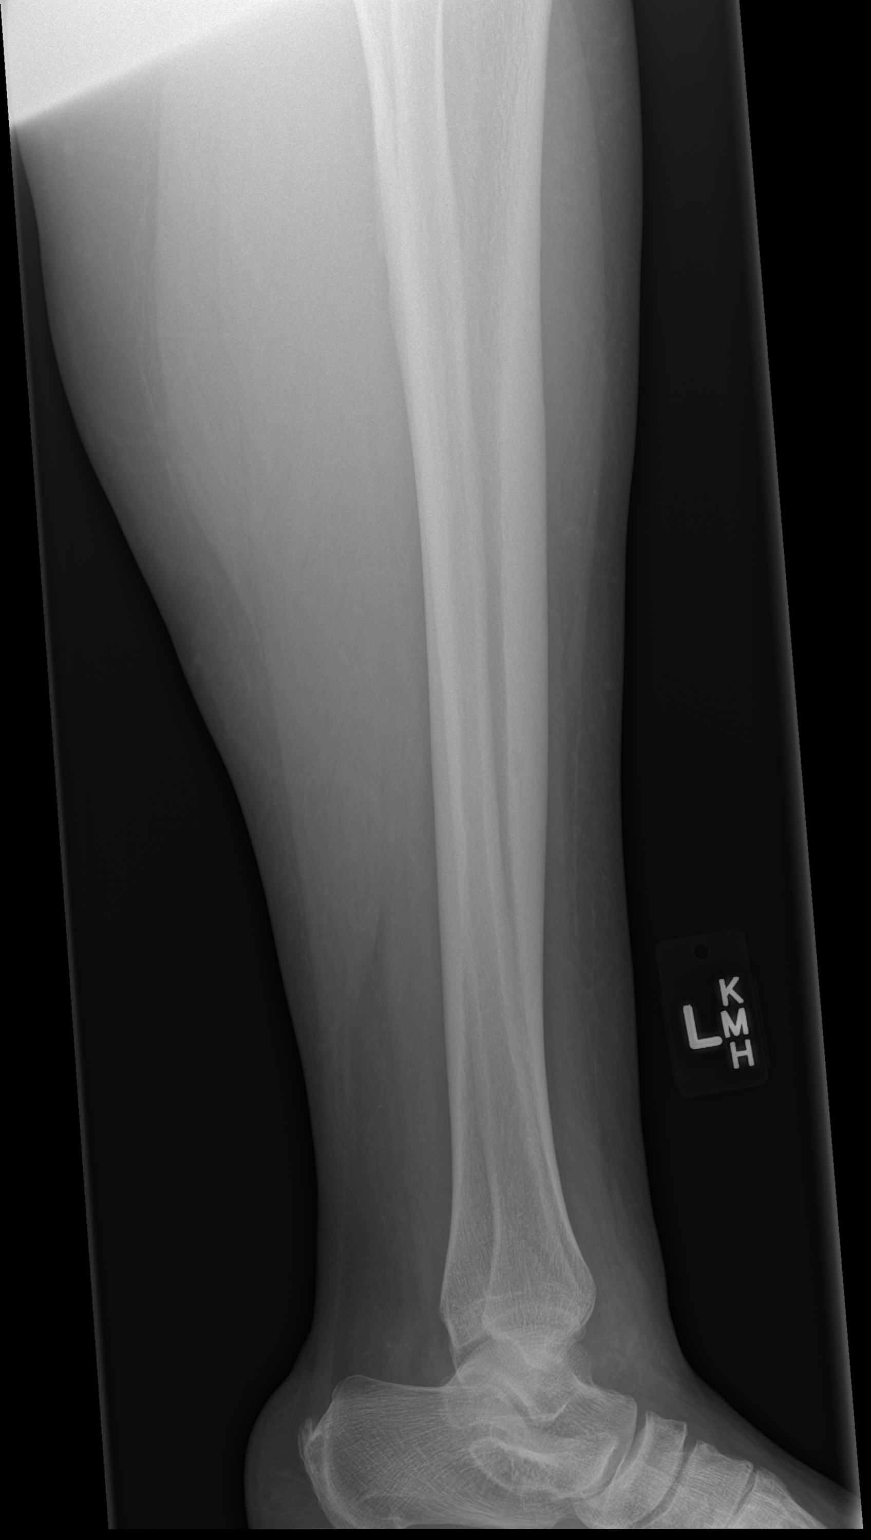

[x tib-fib lat left (2 of 2)]
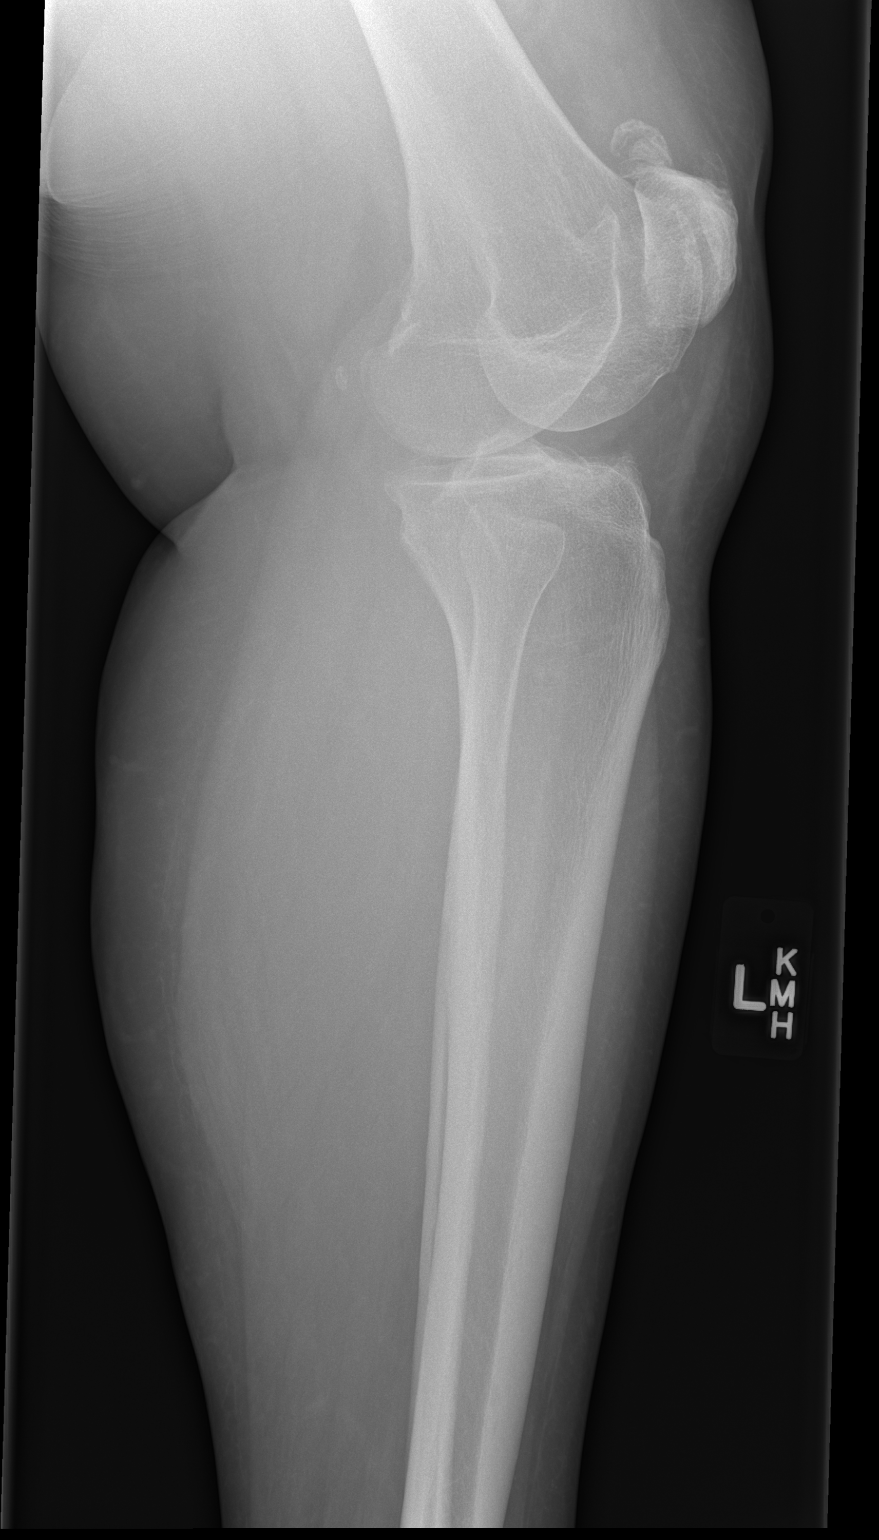

[4 of 4 positions shown; findings below may reference images not displayed]

FINDINGS: Foot: There is no acute fracture or dislocation. A fixation pain
noted in the first metatarsal head. No significant arthritic
changes. The soft tissues appear unremarkable.

Tibia fibula and ankle: There is a faint oblique lucency in the
distal fibula may represent a nondisplaced cortical fracture. There
is no dislocation. The ankle mortise is intact. There is mild soft
tissue swelling over the ankle and lateral malleolus.

There is mild osteoarthritic changes of the knee. A triangular bone
irregularity in the lateral aspect of the lateral tibial plateau
noted which may be chronic and related to prior fracture.
IMPRESSION: 1. No acute fracture or dislocation of the foot.
2. Faint oblique linear lucency in the distal fibula may represent a
nondisplaced cortical fracture. Clinical correlation is recommended.
There is soft tissue swelling over the lateral malleolus.
3. Mild osteoarthritic changes of the knee. Irregularity of the
lateral aspect of the lateral tibial plateau likely chronic.
Clinical correlation is recommended.

## 2021-01-15 ENCOUNTER — Other Ambulatory Visit: Payer: Self-pay | Admitting: Nurse Practitioner

## 2021-01-15 DIAGNOSIS — Z1231 Encounter for screening mammogram for malignant neoplasm of breast: Secondary | ICD-10-CM

## 2021-03-15 ENCOUNTER — Ambulatory Visit: Payer: BLUE CROSS/BLUE SHIELD

## 2023-08-21 ENCOUNTER — Other Ambulatory Visit: Payer: Self-pay | Admitting: Nurse Practitioner

## 2023-08-21 DIAGNOSIS — Z1231 Encounter for screening mammogram for malignant neoplasm of breast: Secondary | ICD-10-CM
